# Patient Record
Sex: Female | Born: 1985 | Race: Black or African American | Hispanic: No | Marital: Single | Smoking: Never smoker
Health system: Southern US, Community
[De-identification: ages and names within clinical notes are randomized; demographics above are authoritative.]

## PROBLEM LIST (undated history)

## (undated) ENCOUNTER — Emergency Department (HOSPITAL_COMMUNITY): Disposition: A | Discharge status: Home / Self Care | Payer: Self-pay

## (undated) DIAGNOSIS — K37 Unspecified appendicitis: Secondary | ICD-10-CM

## (undated) DIAGNOSIS — O139 Gestational [pregnancy-induced] hypertension without significant proteinuria, unspecified trimester: Secondary | ICD-10-CM

## (undated) DIAGNOSIS — F4325 Adjustment disorder with mixed disturbance of emotions and conduct: Secondary | ICD-10-CM

## (undated) DIAGNOSIS — R87613 High grade squamous intraepithelial lesion on cytologic smear of cervix (HGSIL): Secondary | ICD-10-CM

## (undated) DIAGNOSIS — X789XXA Intentional self-harm by unspecified sharp object, initial encounter: Secondary | ICD-10-CM

## (undated) DIAGNOSIS — S61519A Laceration without foreign body of unspecified wrist, initial encounter: Secondary | ICD-10-CM

## (undated) HISTORY — PX: ABSCESS DRAINAGE: SHX1119

## (undated) HISTORY — PX: APPENDECTOMY: SHX54

---

## 2011-11-25 ENCOUNTER — Encounter (HOSPITAL_COMMUNITY): Payer: Self-pay | Admitting: *Deleted

## 2011-11-25 ENCOUNTER — Emergency Department (HOSPITAL_COMMUNITY)
Admission: EM | Admit: 2011-11-25 | Discharge: 2011-11-26 | Disposition: A | Discharge status: Home / Self Care | Payer: Self-pay | Attending: Emergency Medicine | Admitting: Emergency Medicine

## 2011-11-25 DIAGNOSIS — IMO0001 Reserved for inherently not codable concepts without codable children: Secondary | ICD-10-CM | POA: Insufficient documentation

## 2011-11-25 DIAGNOSIS — R5381 Other malaise: Secondary | ICD-10-CM | POA: Insufficient documentation

## 2011-11-25 DIAGNOSIS — N920 Excessive and frequent menstruation with regular cycle: Secondary | ICD-10-CM | POA: Insufficient documentation

## 2011-11-25 DIAGNOSIS — N390 Urinary tract infection, site not specified: Secondary | ICD-10-CM | POA: Insufficient documentation

## 2011-11-25 DIAGNOSIS — R51 Headache: Secondary | ICD-10-CM | POA: Insufficient documentation

## 2011-11-25 NOTE — ED Notes (Signed)
Pt has had a sharp, frontal headache for 2 days.  No fever or chills with this.  No sensitivity to light or sound with this.  Pt is alert and oriented, no neuro deficits with this

## 2011-11-26 LAB — WET PREP, GENITAL: Yeast Wet Prep HPF POC: NONE SEEN

## 2011-11-26 LAB — URINE MICROSCOPIC-ADD ON

## 2011-11-26 LAB — URINALYSIS, ROUTINE W REFLEX MICROSCOPIC
Glucose, UA: NEGATIVE mg/dL
Protein, ur: NEGATIVE mg/dL
pH: 5.5 (ref 5.0–8.0)

## 2011-11-26 MED ORDER — DIPHENHYDRAMINE HCL 50 MG/ML IJ SOLN
25.0000 mg | Freq: Once | INTRAMUSCULAR | Status: AC
  Administered 2011-11-26: 50 mg via INTRAMUSCULAR
  Filled 2011-11-26: qty 1

## 2011-11-26 MED ORDER — METOCLOPRAMIDE HCL 5 MG/ML IJ SOLN
10.0000 mg | Freq: Once | INTRAMUSCULAR | Status: AC
  Administered 2011-11-26: 10 mg via INTRAMUSCULAR
  Filled 2011-11-26: qty 2

## 2011-11-26 MED ORDER — DEXAMETHASONE SODIUM PHOSPHATE 10 MG/ML IJ SOLN
10.0000 mg | Freq: Once | INTRAMUSCULAR | Status: AC
  Administered 2011-11-26: 10 mg via INTRAMUSCULAR
  Filled 2011-11-26: qty 1

## 2011-11-26 MED ORDER — NITROFURANTOIN MONOHYD MACRO 100 MG PO CAPS: 100.0000 mg | ORAL_CAPSULE | Freq: Two times a day (BID) | ORAL | Status: AC

## 2011-11-26 NOTE — Discharge Instructions (Signed)
Your lab tests today showed possible urinary tract infection. This time your providers have decided to go ahead and treat this possible infection with antibiotics. Your given a prescription to use. Please follow the instructions in take medication for the full length of time. Please followup with primary care provider to be sure your symptoms have improved.   Urinary Tract Infection Infections of the urinary tract can start in several places. A bladder infection (cystitis), a kidney infection (pyelonephritis), and a prostate infection (prostatitis) are different types of urinary tract infections (UTIs). They usually get better if treated with medicines (antibiotics) that kill germs. Take all the medicine until it is gone. You or your child may feel better in a few days, but TAKE ALL MEDICINE or the infection may not respond and may become more difficult to treat. HOME CARE INSTRUCTIONS   Drink enough water and fluids to keep the urine clear or pale yellow. Cranberry juice is especially recommended, in addition to large amounts of water.   Avoid caffeine, tea, and carbonated beverages. They tend to irritate the bladder.   Alcohol may irritate the prostate.   Only take over-the-counter or prescription medicines for pain, discomfort, or fever as directed by your caregiver.  To prevent further infections:  Empty the bladder often. Avoid holding urine for long periods of time.   After a bowel movement, women should cleanse from front to back. Use each tissue only once.   Empty the bladder before and after sexual intercourse.  FINDING OUT THE RESULTS OF YOUR TEST Not all test results are available during your visit. If your or your child's test results are not back during the visit, make an appointment with your caregiver to find out the results. Do not assume everything is normal if you have not heard from your caregiver or the medical facility. It is important for you to follow up on all test  results. SEEK MEDICAL CARE IF:   There is back pain.   Your baby is older than 3 months with a rectal temperature of 100.5 F (38.1 C) or higher for more than 1 day.   Your or your child's problems (symptoms) are no better in 3 days. Return sooner if you or your child is getting worse.  SEEK IMMEDIATE MEDICAL CARE IF:   There is severe back pain or lower abdominal pain.   You or your child develops chills.   You have a fever.   Your baby is older than 3 months with a rectal temperature of 102 F (38.9 C) or higher.   Your baby is 35 months old or younger with a rectal temperature of 100.4 F (38 C) or higher.   There is nausea or vomiting.   There is continued burning or discomfort with urination.  MAKE SURE YOU:   Understand these instructions.   Will watch your condition.   Will get help right away if you are not doing well or get worse.  Document Released: 06/12/2005 Document Revised: 08/22/2011 Document Reviewed: 01/15/2007 [facility] Patient Information 2012 [place], [place].

## 2011-11-26 NOTE — ED Notes (Signed)
Isolation added by accident; removed.

## 2011-11-26 NOTE — ED Provider Notes (Signed)
History     CSN: [id number]  Arrival date & time 11/25/11  2033   First MD Initiated Contact with Patient 11/26/11 0005      Chief Complaint  Patient presents with  . Headache     HPI  History provided by the patient. Patient is a 26 year old female with no significant past medical history presents with multiple complaints of headache, bodyaches, increased fatigue and increased vaginal bleeding for the past several days. Patient reports that she has had increased menstrual bleeding for the past 5 days which is more than normal for her. She also reports having headache developed 2 days ago during her menstrual cycle. Headache is a frontal throbbing headache. She denies any aggravating or alleviating factors. She denies any associated fever, chills, sweats, focal neurologic deficits. Patient has not tried taking any over-the-counter medicines for her headache. Patient also reports feeling some associated fatigue. She denies any nausea vomiting diarrhea.     History reviewed. No pertinent past medical history.  Past Surgical History  Procedure Date  . Appendectomy     No family history on file.  History  Substance Use Topics  . Smoking status: Not on file  . Smokeless tobacco: Not on file  . Alcohol Use: No    OB History    Grav Para Term Preterm Abortions TAB SAB Ect Mult Living                  Review of Systems  Constitutional: Positive for fatigue. Negative for fever and chills.  Respiratory: Negative for cough.   Gastrointestinal: Negative for nausea, vomiting, abdominal pain, diarrhea and constipation.  Genitourinary: Negative for dysuria, frequency, hematuria, flank pain, vaginal bleeding and vaginal discharge.  Musculoskeletal: Positive for myalgias.  Neurological: Positive for headaches.  All other systems reviewed and are negative.    Allergies  Review of patient's allergies indicates no known allergies.  Home Medications  No current outpatient  prescriptions on file.  BP 140/88  Pulse 102  Temp(Src) 98.5 F (36.9 C) (Oral)  Resp 16  SpO2 99%  LMP 11/21/2011  Physical Exam  Nursing note and vitals reviewed. Constitutional: She is oriented to person, place, and time. She appears well-developed and well-nourished. No distress.  HENT:  Head: Normocephalic and atraumatic.  Eyes: Conjunctivae and EOM are normal. Pupils are equal, round, and reactive to light.  Neck: Normal range of motion. Neck supple.       No meningeal signs.  Cardiovascular: Normal rate and regular rhythm.   Pulmonary/Chest: Effort normal and breath sounds normal.  Abdominal: Soft.  Genitourinary: Cervix exhibits discharge. Cervix exhibits no motion tenderness and no friability. Right adnexum displays no mass, no tenderness and no fullness. Left adnexum displays no mass, no tenderness and no fullness.       Chaperone was present  Neurological: She is alert and oriented to person, place, and time. She has normal strength. No cranial nerve deficit or sensory deficit. Gait normal.  Skin: Skin is warm and dry. No rash noted.  Psychiatric: She has a normal mood and affect. Her behavior is normal.    ED Course  Procedures  Results for orders placed during the hospital encounter of 11/25/11  URINALYSIS, ROUTINE W REFLEX MICROSCOPIC      Component Value Range   Color, Urine YELLOW  YELLOW    APPearance CLOUDY (*) CLEAR    Specific Gravity, Urine 1.025  1.005 - 1.030    pH 5.5  5.0 - 8.0  Glucose, UA NEGATIVE  NEGATIVE (mg/dL)   Hgb urine dipstick LARGE (*) NEGATIVE    Bilirubin Urine NEGATIVE  NEGATIVE    Ketones, ur NEGATIVE  NEGATIVE (mg/dL)   Protein, ur NEGATIVE  NEGATIVE (mg/dL)   Urobilinogen, UA 0.2  0.0 - 1.0 (mg/dL)   Nitrite NEGATIVE  NEGATIVE    Leukocytes, UA SMALL (*) NEGATIVE   WET PREP, GENITAL      Component Value Range   Yeast Wet Prep HPF POC NONE SEEN  NONE SEEN    Trich, Wet Prep NONE SEEN  NONE SEEN    Clue Cells Wet Prep HPF  POC FEW (*) NONE SEEN    WBC, Wet Prep HPF POC MODERATE (*) NONE SEEN   GC/CHLAMYDIA PROBE AMP, GENITAL      Component Value Range   GC Probe Amp, Genital NEGATIVE  NEGATIVE    Chlamydia, DNA Probe POSITIVE (*) NEGATIVE   POCT PREGNANCY, URINE      Component Value Range   Preg Test, Ur NEGATIVE  NEGATIVE   URINE MICROSCOPIC-ADD ON      Component Value Range   Squamous Epithelial / LPF MANY (*) RARE    WBC, UA 3-6  <3 (WBC/hpf)   RBC / HPF 3-6  <3 (RBC/hpf)   Bacteria, UA FEW (*) RARE    Urine-Other MUCOUS PRESENT          1. UTI (lower urinary tract infection)       MDM  Patient seen and evaluated. Patient no acute distress.        Angus Seller, [place] 11/28/11 [phone]

## 2011-11-27 LAB — GC/CHLAMYDIA PROBE AMP, GENITAL: GC Probe Amp, Genital: NEGATIVE

## 2011-11-30 NOTE — ED Provider Notes (Signed)
Medical screening examination/treatment/procedure(s) were performed by non-physician practitioner and as supervising physician I was immediately available for consultation/collaboration.  Gerhard Munch, MD 11/30/11 2030

## 2011-12-01 NOTE — ED Notes (Signed)
+   Chlamydia. Chart sent to EDP office for review. Chart returned from EDP office. Recommended RX for doxycycline 100 mg PO BID X 7 days. Prescribed by Fayrene Helper PA-C. Attempted to call patient. No answer. Left voicemail for patient to call back.

## 2011-12-02 NOTE — ED Notes (Signed)
No answer on attempt to call.

## 2011-12-25 ENCOUNTER — Emergency Department (HOSPITAL_COMMUNITY)
Admission: EM | Admit: 2011-12-25 | Discharge: 2011-12-25 | Disposition: A | Discharge status: Home / Self Care | Payer: Self-pay | Attending: Emergency Medicine | Admitting: Emergency Medicine

## 2011-12-25 ENCOUNTER — Encounter (HOSPITAL_COMMUNITY): Payer: Self-pay | Admitting: *Deleted

## 2011-12-25 DIAGNOSIS — K029 Dental caries, unspecified: Secondary | ICD-10-CM | POA: Insufficient documentation

## 2011-12-25 DIAGNOSIS — K0889 Other specified disorders of teeth and supporting structures: Secondary | ICD-10-CM

## 2011-12-25 MED ORDER — PENICILLIN V POTASSIUM 500 MG PO TABS: 500.0000 mg | ORAL_TABLET | Freq: Three times a day (TID) | ORAL | Status: AC

## 2011-12-25 MED ORDER — IBUPROFEN 600 MG PO TABS: 600.0000 mg | ORAL_TABLET | Freq: Four times a day (QID) | ORAL | Status: AC | PRN

## 2011-12-25 MED ORDER — HYDROCODONE-ACETAMINOPHEN 5-325 MG PO TABS
1.0000 | ORAL_TABLET | Freq: Once | ORAL | Status: AC
  Administered 2011-12-25: 1 via ORAL
  Filled 2011-12-25: qty 1

## 2011-12-25 MED ORDER — IBUPROFEN 200 MG PO TABS
600.0000 mg | ORAL_TABLET | Freq: Once | ORAL | Status: AC
  Administered 2011-12-25: 600 mg via ORAL
  Filled 2011-12-25: qty 3

## 2011-12-25 MED ORDER — HYDROCODONE-ACETAMINOPHEN 5-500 MG PO TABS: 1.0000 | ORAL_TABLET | Freq: Four times a day (QID) | ORAL | Status: AC | PRN

## 2011-12-25 NOTE — Discharge Instructions (Signed)
Take ibuprofen for your pain. Vicodin for severe pain as prescribed as needed. Do not drive if taking. Take penicillin as prescribed until all gone. Follow up with a dentist for further evaluation and treatment.   Dental Pain A tooth ache may be caused by cavities (tooth decay). Cavities expose the nerve of the tooth to air and hot or cold temperatures. It may come from an infection or abscess (also called a boil or furuncle) around your tooth. It is also often caused by dental caries (tooth decay). This causes the pain you are having. DIAGNOSIS  Your caregiver can diagnose this problem by exam. TREATMENT   If caused by an infection, it may be treated with medications which kill germs (antibiotics) and pain medications as prescribed by your caregiver. Take medications as directed.   Only take over-the-counter or prescription medicines for pain, discomfort, or fever as directed by your caregiver.   Whether the tooth ache today is caused by infection or dental disease, you should see your dentist as soon as possible for further care.  SEEK MEDICAL CARE IF: The exam and treatment you received today has been provided on an emergency basis only. This is not a substitute for complete medical or dental care. If your problem worsens or new problems (symptoms) appear, and you are unable to meet with your dentist, call or return to this location. SEEK IMMEDIATE MEDICAL CARE IF:   You have a fever.   You develop redness and swelling of your face, jaw, or neck.   You are unable to open your mouth.   You have severe pain uncontrolled by pain medicine.  MAKE SURE YOU:   Understand these instructions.   Will watch your condition.   Will get help right away if you are not doing well or get worse.  Document Released: 09/02/2005 Document Revised: 08/22/2011 Document Reviewed: 04/20/2008 [facility] Patient Information 2012 [place], [place].

## 2011-12-25 NOTE — ED Notes (Signed)
Patient states history of a cap on tooth that is "already dead" however three days ago developed dental pain at capped tooth and 2 days ago able to remove cap. Pain worsening overtime currently 10/10 throbbing. Air way intact bilateral equal chest rise and fall.

## 2011-12-25 NOTE — ED Provider Notes (Signed)
History     CSN: [id number]  Arrival date & time 12/25/11  1148   First MD Initiated Contact with Patient 12/25/11 1213      Chief Complaint  Patient presents with  . Dental Pain    (Consider location/radiation/quality/duration/timing/severity/associated sxs/prior treatment) Patient is a 26 y.o. female presenting with tooth pain. The history is provided by the patient.  Dental PainThe primary symptoms include mouth pain and dental injury. The symptoms began 2 days ago. The symptoms are improving. The symptoms occur constantly.  Additional symptoms include: gum swelling and gum tenderness.  Pt states she had a filling and a crown placed on that tooth about 3 ya. States about 3 days ago, crown fell off, and she is having pain in that tooth. States tooth is chipping, little pieces at a time. Admits to gum pain and swelling. No fever, chills, facial swelling. No other complaints. Did not take any medications for this.   History reviewed. No pertinent past medical history.  Past Surgical History  Procedure Date  . Appendectomy     History reviewed. No pertinent family history.  History  Substance Use Topics  . Smoking status: Not on file  . Smokeless tobacco: Not on file  . Alcohol Use: No    OB History    Grav Para Term Preterm Abortions TAB SAB Ect Mult Living                  Review of Systems  HENT: Positive for dental problem.   All other systems reviewed and are negative.    Allergies  Review of patient's allergies indicates no known allergies.  Home Medications  No current outpatient prescriptions on file.  BP 140/94  Pulse 93  Temp(Src) 98.3 F (36.8 C) (Oral)  Resp 14  SpO2 99%  LMP 11/19/2011  Physical Exam  Nursing note and vitals reviewed. Constitutional: She is oriented to person, place, and time. She appears well-developed and well-nourished. No distress.  HENT:  Head: Normocephalic.  Right Ear: External ear normal.  Left Ear: External ear  normal.  Nose: Nose normal.  Mouth/Throat: Oropharynx is clear and moist.       Right lower fist molar crown is missing. Tooth decayed to the gumline. Redness around the tooth, gum swelling. Tender to palpation.   Eyes: Conjunctivae are normal.  Neck: Neck supple.  Cardiovascular: Normal rate, regular rhythm and normal heart sounds.   Pulmonary/Chest: Effort normal and breath sounds normal. No respiratory distress. She has no wheezes.  Neurological: She is alert and oriented to person, place, and time.  Skin: Skin is warm and dry.  Psychiatric: She has a normal mood and affect.    ED Course  Procedures (including critical care time)   Dental carries with possible infection. Pt afebrile, no lymphadenopathy. No facial swelling. Will d/c home with pain medications, antibiotics, follow up.   1. Pain, dental       MDM          Lottie Mussel, PA 12/25/11 1558

## 2011-12-25 NOTE — ED Notes (Signed)
Reports right side lower toothache x 3 days and now has headache.

## 2011-12-27 NOTE — ED Provider Notes (Signed)
Medical screening examination/treatment/procedure(s) were performed by non-physician practitioner and as supervising physician I was immediately available for consultation/collaboration.  Silas Muff T Marnesha Gagen, MD 12/27/11 1546 

## 2012-04-16 ENCOUNTER — Encounter (HOSPITAL_COMMUNITY): Payer: Self-pay | Admitting: Emergency Medicine

## 2012-04-16 ENCOUNTER — Emergency Department (HOSPITAL_COMMUNITY)
Admission: EM | Admit: 2012-04-16 | Discharge: 2012-04-16 | Disposition: A | Discharge status: Home / Self Care | Payer: Self-pay | Attending: Emergency Medicine | Admitting: Emergency Medicine

## 2012-04-16 DIAGNOSIS — K029 Dental caries, unspecified: Secondary | ICD-10-CM | POA: Insufficient documentation

## 2012-04-16 DIAGNOSIS — K0889 Other specified disorders of teeth and supporting structures: Secondary | ICD-10-CM

## 2012-04-16 MED ORDER — OXYCODONE-ACETAMINOPHEN 5-325 MG PO TABS: 1.0000 | ORAL_TABLET | Freq: Four times a day (QID) | ORAL | Status: AC | PRN

## 2012-04-16 MED ORDER — PENICILLIN V POTASSIUM 500 MG PO TABS: 500.0000 mg | ORAL_TABLET | Freq: Three times a day (TID) | ORAL | Status: AC

## 2012-04-16 MED ORDER — IBUPROFEN 600 MG PO TABS: 600.0000 mg | ORAL_TABLET | Freq: Four times a day (QID) | ORAL | Status: AC | PRN

## 2012-04-16 NOTE — ED Notes (Signed)
Pt reports tooth ache on L lower and upper molar that's affecting L face and L ear- symptoms started last night; reports was eating candy and then it started but has not gone away

## 2012-04-16 NOTE — ED Notes (Signed)
Pt has crown missing and cavity that fell out.  Pt having pain on both sides of mouth and jaw.  Pt has not seen dentist.

## 2012-04-16 NOTE — ED Provider Notes (Signed)
History   This chart was scribed for Candace Chick, MD by Shari Heritage. The patient was seen in room TR02C/TR02C. Patient's care was started at 1527.     CSN: [id number]  Arrival date & time 04/16/12  1527   First MD Initiated Contact with Patient 04/16/12 1632      Chief Complaint  Patient presents with  . Dental Pain    (Consider location/radiation/quality/duration/timing/severity/associated sxs/prior treatment) Patient is a 26 y.o. female presenting with tooth pain. The history is provided by the patient. No language interpreter was used.  Dental PainThe primary symptoms include mouth pain. The symptoms began yesterday. The symptoms are worsening. The symptoms are new. The symptoms occur constantly.  Mouth pain began 12 - 24 hours ago. Mouth pain occurs constantly. Mouth pain is worsening. Affected locations include: teeth and cheek(s).    Candace Rivera is a 26 y.o. female who presents to the Emergency Department complaining of moderate, constant left lower molar (dental) pain that radiates up to her left ear onset 1 day ago. Patient states that she was eating candy when pain started and that it has been persistent since then. Patient reports that she has a cavity in the left lower molar that hasn't been filled. Patient states that she has felt warm, but hasn't taken her temperature. Temperature in the ED at 3:34pm today was 97.2. Patient does not have a regular dentist. Patient has never smoked.  History reviewed. No pertinent past medical history.  Past Surgical History  Procedure Date  . Appendectomy     No family history on file.  History  Substance Use Topics  . Smoking status: Never Smoker   . Smokeless tobacco: Not on file  . Alcohol Use: No    OB History    Grav Para Term Preterm Abortions TAB SAB Ect Mult Living                  Review of Systems  HENT: Positive for dental problem.   All other systems reviewed and are negative.    Allergies  Review  of patient's allergies indicates no known allergies.  Home Medications   Current Outpatient Rx  Name Route Sig Dispense Refill  . IBUPROFEN 600 MG PO TABS Oral Take 1 tablet (600 mg total) by mouth every 6 (six) hours as needed for pain. 30 tablet 0  . OXYCODONE-ACETAMINOPHEN 5-325 MG PO TABS Oral Take 1-2 tablets by mouth every 6 (six) hours as needed for pain. 15 tablet 0  . PENICILLIN V POTASSIUM 500 MG PO TABS Oral Take 1 tablet (500 mg total) by mouth 3 (three) times daily. 30 tablet 0    BP 112/81  Pulse 90  Temp 97.9 F (36.6 C) (Oral)  Resp 14  SpO2 100%  LMP 03/21/2012  Physical Exam  Nursing note and vitals reviewed. Constitutional: She is oriented to person, place, and time. She appears well-developed and well-nourished. No distress.  HENT:  Head: Normocephalic and atraumatic.  Mouth/Throat: No dental abscesses.       Left lower posterior molar is approximately 50% eroded. No evidence of abscess.  Eyes: EOM are normal. Pupils are equal, round, and reactive to light.  Neck: Neck supple. No tracheal deviation present.  Pulmonary/Chest: No respiratory distress.  Musculoskeletal: Normal range of motion. She exhibits no edema.  Neurological: She is alert and oriented to person, place, and time. No sensory deficit.  Skin: Skin is warm and dry.  Psychiatric: She has a normal mood and affect.  Her behavior is normal.    ED Course  Procedures (including critical care time) DIAGNOSTIC STUDIES: Oxygen Saturation is 100% on room air, normal by my interpretation.    COORDINATION OF CARE: 4:53pm- Patient informed of current plan for treatment and evaluation and agrees with plan at this time. Patient should follow up with a dentist to address her dental pain. Have provided contact information for several dentists so patient can make an appointment. Will discharge patient with prescriptions for Percocet 5-325 mg, Ibuprofen 600mg  and Veetid 500 mg.   Labs Reviewed - No data to  display  No results found.   1. Pain, dental       MDM  Pt with dental pain, presents with overall poor dentition, decay.  No periapical abscess.  Given rx for pain meds as well as antibiotics.  Discharged with strict return precautions.  Pt agreeable with plan.  Strongly encouraged to arrange for dental followup    I personally performed the services described in this documentation, which was scribed in my presence. The recorded information has been reviewed and considered.   Candace Chick, MD 04/17/12 [phone]

## 2012-07-08 ENCOUNTER — Emergency Department (HOSPITAL_COMMUNITY)
Admission: EM | Admit: 2012-07-08 | Discharge: 2012-07-08 | Disposition: A | Discharge status: Home / Self Care | Payer: Self-pay | Attending: Emergency Medicine | Admitting: Emergency Medicine

## 2012-07-08 DIAGNOSIS — M549 Dorsalgia, unspecified: Secondary | ICD-10-CM | POA: Insufficient documentation

## 2012-07-08 DIAGNOSIS — N898 Other specified noninflammatory disorders of vagina: Secondary | ICD-10-CM | POA: Insufficient documentation

## 2012-07-08 DIAGNOSIS — R51 Headache: Secondary | ICD-10-CM | POA: Insufficient documentation

## 2012-07-08 LAB — WET PREP, GENITAL
Clue Cells Wet Prep HPF POC: NONE SEEN
Trich, Wet Prep: NONE SEEN
Yeast Wet Prep HPF POC: NONE SEEN

## 2012-07-08 LAB — URINALYSIS, ROUTINE W REFLEX MICROSCOPIC
Leukocytes, UA: NEGATIVE
Nitrite: NEGATIVE
Protein, ur: NEGATIVE mg/dL
Specific Gravity, Urine: 1.026 (ref 1.005–1.030)
Urobilinogen, UA: 0.2 mg/dL (ref 0.0–1.0)

## 2012-07-08 LAB — PREGNANCY, URINE: Preg Test, Ur: NEGATIVE

## 2012-07-08 MED ORDER — CEFTRIAXONE SODIUM 250 MG IJ SOLR
250.0000 mg | Freq: Once | INTRAMUSCULAR | Status: AC
  Administered 2012-07-08: 250 mg via INTRAMUSCULAR
  Filled 2012-07-08: qty 250

## 2012-07-08 MED ORDER — DOXYCYCLINE HYCLATE 100 MG PO CAPS: 100.0000 mg | ORAL_CAPSULE | Freq: Two times a day (BID) | ORAL | Status: DC

## 2012-07-08 MED ORDER — AZITHROMYCIN 250 MG PO TABS: 1000.0000 mg | ORAL_TABLET | Freq: Once | ORAL | Status: DC

## 2012-07-08 MED ORDER — METRONIDAZOLE 500 MG PO TABS
2000.0000 mg | ORAL_TABLET | Freq: Once | ORAL | Status: AC
  Administered 2012-07-08: 2000 mg via ORAL
  Filled 2012-07-08: qty 1

## 2012-07-08 MED ORDER — LIDOCAINE HCL (PF) 1 % IJ SOLN
INTRAMUSCULAR | Status: AC
  Filled 2012-07-08: qty 5

## 2012-07-08 NOTE — ED Notes (Signed)
Pt watching TV. Denies pain at present. Awaiting pelvic exam.

## 2012-07-08 NOTE — ED Provider Notes (Signed)
History     CSN: [id number]  Arrival date & time 07/08/12  0827   First MD Initiated Contact with Patient 07/08/12 [phone]      Chief Complaint  Patient presents with  . Vaginal Discharge  . Headache  . Back Pain    (Consider location/radiation/quality/duration/timing/severity/associated sxs/prior treatment) HPI Patient presents to the emergency department with a 2 week history of vaginal discharge.  Patient, states, that she has a partner, that she sexually active with that states he has Trichomonas.  Patient, states, that she's not had any fevers, nausea, vomiting, diarrhea, back pain, or weakness.  Patient, states, that the discharge does not have any specific foul odor or color.  Patient, states, that she's had some mild lower abdominal pain.  Patient denies any treatment prior to arrival, for her symptoms.  Patient, states, that nothing seems to make the discomfort, better or worse. No past medical history on file.  Past Surgical History  Procedure Date  . Appendectomy     No family history on file.  History  Substance Use Topics  . Smoking status: Never Smoker   . Smokeless tobacco: Not on file  . Alcohol Use: No    OB History    Grav Para Term Preterm Abortions TAB SAB Ect Mult Living                  Review of Systems All other systems negative except as documented in the HPI. All pertinent positives and negatives as reviewed in the HPI.  Allergies  Review of patient's allergies indicates no known allergies.  Home Medications  No current outpatient prescriptions on file.  BP 104/70  Pulse 88  Temp 98.2 F (36.8 C) (Oral)  Resp 20  LMP 07/01/2012  Physical Exam  Nursing note and vitals reviewed. Constitutional: She is oriented to person, place, and time. She appears well-developed and well-nourished. No distress.  HENT:  Head: Normocephalic and atraumatic.  Eyes: Pupils are equal, round, and reactive to light.  Neck: Normal range of motion. Neck  supple.  Cardiovascular: Normal rate, regular rhythm and normal heart sounds.   Pulmonary/Chest: Effort normal and breath sounds normal.  Genitourinary: There is no rash, tenderness or lesion on the right labia. There is no rash, tenderness or lesion on the left labia. Cervix exhibits discharge. Cervix exhibits no motion tenderness. Right adnexum displays no mass and no tenderness. Left adnexum displays no mass and no tenderness. No erythema, tenderness or bleeding around the vagina. Vaginal discharge found.  Neurological: She is alert and oriented to person, place, and time.  Skin: Skin is warm and dry. No rash noted.    ED Course  Procedures (including critical care time)  Labs Reviewed  URINALYSIS, ROUTINE W REFLEX MICROSCOPIC - Abnormal; Notable for the following:    APPearance CLOUDY (*)     All other components within normal limits  WET PREP, GENITAL - Abnormal; Notable for the following:    WBC, Wet Prep HPF POC FEW (*)     All other components within normal limits  PREGNANCY, URINE  GC/CHLAMYDIA PROBE AMP, GENITAL   Patient be treated for a vaginal infection, along with possible low-level PID.  Patient is advised to return here for any worsening in her condition.  Patient is advised to follow with the GYN for further care and evaluation.   MDM  MDM Reviewed: vitals and nursing note Interpretation: labs            Carlyle Dolly, PA-C  07/08/12 1227 

## 2012-07-08 NOTE — ED Notes (Signed)
Pt states her sexual partner was dx with trich on Oct 21st and want to be treated. Also complains of low back pain and headache for a week

## 2012-07-08 NOTE — ED Notes (Signed)
Pelvic exam completed by TEch and PA. Specimens obtained and sent to lab

## 2012-07-08 NOTE — ED Provider Notes (Signed)
Medical screening examination/treatment/procedure(s) were performed by non-physician practitioner and as supervising physician I was immediately available for consultation/collaboration.   Carleene Cooper III, MD 07/08/12 [phone]

## 2012-07-08 NOTE — ED Notes (Signed)
Pelvic cart set up. And procedure explained to patient

## 2012-07-09 LAB — GC/CHLAMYDIA PROBE AMP, GENITAL
Chlamydia, DNA Probe: NEGATIVE
GC Probe Amp, Genital: NEGATIVE

## 2012-08-29 ENCOUNTER — Emergency Department (HOSPITAL_COMMUNITY)
Admission: EM | Admit: 2012-08-29 | Discharge: 2012-08-29 | Disposition: A | Discharge status: Home / Self Care | Payer: Self-pay | Attending: Emergency Medicine | Admitting: Emergency Medicine

## 2012-08-29 ENCOUNTER — Encounter (HOSPITAL_COMMUNITY): Payer: Self-pay

## 2012-08-29 DIAGNOSIS — K029 Dental caries, unspecified: Secondary | ICD-10-CM | POA: Insufficient documentation

## 2012-08-29 DIAGNOSIS — R238 Other skin changes: Secondary | ICD-10-CM

## 2012-08-29 DIAGNOSIS — L089 Local infection of the skin and subcutaneous tissue, unspecified: Secondary | ICD-10-CM | POA: Insufficient documentation

## 2012-08-29 DIAGNOSIS — H9209 Otalgia, unspecified ear: Secondary | ICD-10-CM | POA: Insufficient documentation

## 2012-08-29 MED ORDER — IBUPROFEN 600 MG PO TABS: 600.0000 mg | ORAL_TABLET | Freq: Four times a day (QID) | ORAL | Status: DC | PRN

## 2012-08-29 MED ORDER — TRAMADOL HCL 50 MG PO TABS: 50.0000 mg | ORAL_TABLET | Freq: Four times a day (QID) | ORAL | Status: DC | PRN

## 2012-08-29 NOTE — ED Provider Notes (Signed)
Medical screening examination/treatment/procedure(s) were performed by non-physician practitioner and as supervising physician I was immediately available for consultation/collaboration.   Charles B. Bernette Mayers, MD 08/29/12 2249

## 2012-08-29 NOTE — ED Notes (Signed)
Pt reports (L) side upper and lower teeth pain x2 days, pt also reports (L) earache and itching d/t toothache

## 2012-08-29 NOTE — ED Provider Notes (Signed)
History     CSN: [id number]  Arrival date & time 08/29/12  1905   First MD Initiated Contact with Patient 08/29/12 1951      Chief Complaint  Patient presents with  . Dental Pain    (Consider location/radiation/quality/duration/timing/severity/associated sxs/prior treatment) HPI Comments: Patient has been to the emergency department 3 times since April of this year for persistent dental decay.  She has not needed.  Dentist appointment.  She has been on several courses of antibiotic Percocet, and ibuprofen for the same dental disease.  Tonight.  She reports, that her breast or itching.  Her last menstrual cycle was 2 weeks, ago  Patient is a 26 y.o. female presenting with tooth pain. The history is provided by the patient.  Dental PainThe primary symptoms include mouth pain. Primary symptoms do not include headaches or fever.  Additional symptoms include: ear pain.    History reviewed. No pertinent past medical history.  Past Surgical History  Procedure Date  . Appendectomy     History reviewed. No pertinent family history.  History  Substance Use Topics  . Smoking status: Never Smoker   . Smokeless tobacco: Not on file  . Alcohol Use: No    OB History    Grav Para Term Preterm Abortions TAB SAB Ect Mult Living                  Review of Systems  Constitutional: Negative for fever.  HENT: Positive for ear pain and dental problem. Negative for rhinorrhea.   Skin: Negative for rash and wound.  Neurological: Negative for dizziness and headaches.    Allergies  Review of patient's allergies indicates no known allergies.  Home Medications   Current Outpatient Rx  Name  Route  Sig  Dispense  Refill  . DOXYCYCLINE HYCLATE 100 MG PO CAPS   Oral   Take 1 capsule (100 mg total) by mouth 2 (two) times daily.   28 capsule   0     BP 119/75  Pulse 95  Temp 98.3 F (36.8 C) (Oral)  Resp 18  SpO2 99%  LMP 08/17/2012  Physical Exam  Constitutional: She  appears well-developed and well-nourished.  HENT:  Head: Normocephalic.  Right Ear: External ear normal.  Left Ear: External ear normal.  Nose: Nose normal.  Mouth/Throat: Oropharynx is clear and moist.       Dental decay, in all teeth, except the front central incisors, upper and lower.  There is no gum erythema  Eyes: Pupils are equal, round, and reactive to light.  Neck: Normal range of motion.  Cardiovascular: Normal rate.   Pulmonary/Chest: Effort normal.       Breasts are soft.  No nipple discharge.  No rash or erythema or skin changes  Musculoskeletal: Normal range of motion.  Neurological: She is alert.  Skin: Skin is warm. No rash noted. No erythema.    ED Course  Procedures (including critical care time)  Labs Reviewed - No data to display No results found.   No diagnosis found.    MDM           Arman Filter, NP 08/29/12 2042

## 2012-08-29 NOTE — ED Notes (Addendum)
Pt also reports bilateral breast pain, pt grabs both breast and sts "all of this hurts." Pt denies chest pain, SOB, or cough.

## 2013-03-08 ENCOUNTER — Emergency Department (HOSPITAL_COMMUNITY)
Admission: EM | Admit: 2013-03-08 | Discharge: 2013-03-08 | Disposition: A | Discharge status: Home / Self Care | Payer: Self-pay | Attending: Emergency Medicine | Admitting: Emergency Medicine

## 2013-03-08 ENCOUNTER — Encounter (HOSPITAL_COMMUNITY): Payer: Self-pay | Admitting: *Deleted

## 2013-03-08 DIAGNOSIS — K089 Disorder of teeth and supporting structures, unspecified: Secondary | ICD-10-CM | POA: Insufficient documentation

## 2013-03-08 NOTE — ED Notes (Signed)
Patient no answer x3

## 2013-03-08 NOTE — ED Notes (Signed)
Pt is here with lower dental tooth and pain radiates into ear and head for 3 days

## 2013-04-20 ENCOUNTER — Emergency Department (HOSPITAL_COMMUNITY): Payer: Self-pay

## 2013-04-20 ENCOUNTER — Emergency Department (HOSPITAL_COMMUNITY)
Admission: EM | Admit: 2013-04-20 | Discharge: 2013-04-20 | Disposition: A | Discharge status: Home / Self Care | Payer: Self-pay | Attending: Emergency Medicine | Admitting: Emergency Medicine

## 2013-04-20 ENCOUNTER — Encounter (HOSPITAL_COMMUNITY): Payer: Self-pay | Admitting: Emergency Medicine

## 2013-04-20 DIAGNOSIS — S8992XA Unspecified injury of left lower leg, initial encounter: Secondary | ICD-10-CM

## 2013-04-20 DIAGNOSIS — Y9389 Activity, other specified: Secondary | ICD-10-CM | POA: Insufficient documentation

## 2013-04-20 DIAGNOSIS — Z3202 Encounter for pregnancy test, result negative: Secondary | ICD-10-CM | POA: Insufficient documentation

## 2013-04-20 DIAGNOSIS — Y92410 Unspecified street and highway as the place of occurrence of the external cause: Secondary | ICD-10-CM | POA: Insufficient documentation

## 2013-04-20 DIAGNOSIS — S8990XA Unspecified injury of unspecified lower leg, initial encounter: Secondary | ICD-10-CM | POA: Insufficient documentation

## 2013-04-20 LAB — POCT I-STAT, CHEM 8
BUN: 9 mg/dL (ref 6–23)
Chloride: 108 mEq/L (ref 96–112)
Sodium: 143 mEq/L (ref 135–145)
TCO2: 24 mmol/L (ref 0–100)

## 2013-04-20 LAB — POCT PREGNANCY, URINE: Preg Test, Ur: NEGATIVE

## 2013-04-20 MED ORDER — PHENYLEPHRINE 200 MCG/ML FOR PRIAPISM / HYPOTENSION: 500.0000 ug | Freq: Once | INTRAMUSCULAR | Status: DC

## 2013-04-20 MED ORDER — ONDANSETRON HCL 4 MG/2ML IJ SOLN: 4.0000 mg | Freq: Once | INTRAMUSCULAR | Status: AC

## 2013-04-20 MED ORDER — PHENYLEPHRINE HCL 10 MG/ML IJ SOLN: 500.0000 ug | Freq: Once | INTRAMUSCULAR | Status: DC

## 2013-04-20 MED ORDER — OXYCODONE-ACETAMINOPHEN 5-325 MG PO TABS: 2.0000 | ORAL_TABLET | ORAL | Status: DC | PRN

## 2013-04-20 MED ORDER — IOHEXOL 350 MG/ML SOLN
100.0000 mL | Freq: Once | INTRAVENOUS | Status: AC | PRN
  Administered 2013-04-20: 100 mL via INTRAVENOUS

## 2013-04-20 MED ORDER — FENTANYL CITRATE 0.05 MG/ML IJ SOLN
50.0000 ug | INTRAMUSCULAR | Status: DC | PRN
  Administered 2013-04-20: 50 ug via INTRAVENOUS
  Filled 2013-04-20: qty 2

## 2013-04-20 MED ORDER — ONDANSETRON HCL 4 MG/2ML IJ SOLN
INTRAMUSCULAR | Status: AC
  Administered 2013-04-20: 4 mg via INTRAVENOUS
  Filled 2013-04-20: qty 2
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
## 2013-04-26 ENCOUNTER — Encounter (HOSPITAL_COMMUNITY): Payer: Self-pay | Admitting: Emergency Medicine

## 2013-04-26 ENCOUNTER — Emergency Department (HOSPITAL_COMMUNITY)
Admission: EM | Admit: 2013-04-26 | Discharge: 2013-04-26 | Disposition: A | Discharge status: Home / Self Care | Payer: Self-pay | Attending: Emergency Medicine | Admitting: Emergency Medicine

## 2013-04-26 DIAGNOSIS — Y9289 Other specified places as the place of occurrence of the external cause: Secondary | ICD-10-CM | POA: Insufficient documentation

## 2013-04-26 DIAGNOSIS — S86911D Strain of unspecified muscle(s) and tendon(s) at lower leg level, right leg, subsequent encounter: Secondary | ICD-10-CM

## 2013-04-26 DIAGNOSIS — Y9389 Activity, other specified: Secondary | ICD-10-CM | POA: Insufficient documentation

## 2013-04-26 DIAGNOSIS — IMO0002 Reserved for concepts with insufficient information to code with codable children: Secondary | ICD-10-CM | POA: Insufficient documentation

## 2013-04-26 MED ORDER — NAPROXEN 500 MG PO TABS: 500.0000 mg | ORAL_TABLET | Freq: Two times a day (BID) | ORAL | Status: DC

## 2013-04-26 MED ORDER — OXYCODONE-ACETAMINOPHEN 5-325 MG PO TABS: 1.0000 | ORAL_TABLET | ORAL | Status: DC | PRN
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
## 2013-04-26 NOTE — ED Notes (Signed)
Pt. Stated, I'm having bad rt. Knee pain, seen at [facility] last Tuesday and given brace.
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
## 2013-04-30 NOTE — ED Provider Notes (Signed)
Medical screening examination/treatment/procedure(s) were performed by non-physician practitioner and as supervising physician I was immediately available for consultation/collaboration.   Thia Olesen Joseph Gracemarie Skeet, MD 04/30/13 1105 

## 2013-06-24 ENCOUNTER — Emergency Department (HOSPITAL_COMMUNITY)
Admission: EM | Admit: 2013-06-24 | Discharge: 2013-06-24 | Disposition: A | Discharge status: Home / Self Care | Payer: No Typology Code available for payment source | Attending: Emergency Medicine | Admitting: Emergency Medicine

## 2013-06-24 ENCOUNTER — Encounter (HOSPITAL_COMMUNITY): Payer: Self-pay | Admitting: Emergency Medicine

## 2013-06-24 DIAGNOSIS — N751 Abscess of Bartholin's gland: Secondary | ICD-10-CM | POA: Insufficient documentation

## 2013-06-24 MED ORDER — OXYCODONE-ACETAMINOPHEN 5-325 MG PO TABS
2.0000 | ORAL_TABLET | Freq: Once | ORAL | Status: AC
  Administered 2013-06-24: 2 via ORAL
  Filled 2013-06-24: qty 2
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
## 2013-06-24 NOTE — ED Notes (Signed)
Pt. reports right side vaginal pain " knot" for 1 week , denies injury .

## 2014-06-29 ENCOUNTER — Encounter (HOSPITAL_COMMUNITY): Payer: Self-pay | Admitting: Emergency Medicine

## 2014-06-29 ENCOUNTER — Emergency Department (HOSPITAL_COMMUNITY)
Admission: EM | Admit: 2014-06-29 | Discharge: 2014-06-29 | Disposition: A | Discharge status: Home / Self Care | Payer: No Typology Code available for payment source | Attending: Emergency Medicine | Admitting: Emergency Medicine

## 2014-06-29 DIAGNOSIS — G8929 Other chronic pain: Secondary | ICD-10-CM | POA: Insufficient documentation

## 2014-06-29 DIAGNOSIS — S3992XA Unspecified injury of lower back, initial encounter: Secondary | ICD-10-CM | POA: Insufficient documentation

## 2014-06-29 DIAGNOSIS — T07 Unspecified multiple injuries: Secondary | ICD-10-CM | POA: Insufficient documentation

## 2014-06-29 DIAGNOSIS — S8991XA Unspecified injury of right lower leg, initial encounter: Secondary | ICD-10-CM | POA: Insufficient documentation

## 2014-06-29 DIAGNOSIS — S4992XA Unspecified injury of left shoulder and upper arm, initial encounter: Secondary | ICD-10-CM | POA: Insufficient documentation

## 2014-06-29 DIAGNOSIS — T07XXXA Unspecified multiple injuries, initial encounter: Secondary | ICD-10-CM

## 2014-06-29 MED ORDER — IBUPROFEN 800 MG PO TABS: 800.0000 mg | ORAL_TABLET | Freq: Three times a day (TID) | ORAL | Status: DC

## 2014-06-29 NOTE — ED Notes (Signed)
Declined W/C at D/C and was escorted to lobby by RN. 
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
## 2014-06-29 NOTE — Discharge Instructions (Signed)
Cryotherapy °Cryotherapy means treatment with cold. Ice or gel packs can be used to reduce both pain and swelling. Ice is the most helpful within the first 24 to 48 hours after an injury or flare-up from overusing a muscle or joint. Sprains, strains, spasms, burning pain, shooting pain, and aches can all be eased with ice. Ice can also be used when recovering from surgery. Ice is effective, has very few side effects, and is safe for most people to use. °PRECAUTIONS  °Ice is not a safe treatment option for people with: °· Raynaud phenomenon. This is a condition affecting small blood vessels in the extremities. Exposure to cold may cause your problems to return. °· Cold hypersensitivity. There are many forms of cold hypersensitivity, including: °¨ Cold urticaria. Red, itchy hives appear on the skin when the tissues begin to warm after being iced. °¨ Cold erythema. This is a red, itchy rash caused by exposure to cold. °¨ Cold hemoglobinuria. Red blood cells break down when the tissues begin to warm after being iced. The hemoglobin that carry oxygen are passed into the urine because they cannot combine with blood proteins fast enough. °· Numbness or altered sensitivity in the area being iced. °If you have any of the following conditions, do not use ice until you have discussed cryotherapy with your caregiver: °· Heart conditions, such as arrhythmia, angina, or chronic heart disease. °· High blood pressure. °· Healing wounds or open skin in the area being iced. °· Current infections. °· Rheumatoid arthritis. °· Poor circulation. °· Diabetes. °Ice slows the blood flow in the region it is applied. This is beneficial when trying to stop inflamed tissues from spreading irritating chemicals to surrounding tissues. However, if you expose your skin to cold temperatures for too long or without the proper protection, you can damage your skin or nerves. Watch for signs of skin damage due to cold. °HOME CARE INSTRUCTIONS °Follow  these tips to use ice and cold packs safely. °· Place a dry or damp towel between the ice and skin. A damp towel will cool the skin more quickly, so you may need to shorten the time that the ice is used. °· For a more rapid response, add gentle compression to the ice. °· Ice for no more than 10 to 20 minutes at a time. The bonier the area you are icing, the less time it will take to get the benefits of ice. °· Check your skin after 5 minutes to make sure there are no signs of a poor response to cold or skin damage. °· Rest 20 minutes or more between uses. °· Once your skin is numb, you can end your treatment. You can test numbness by very lightly touching your skin. The touch should be so light that you do not see the skin dimple from the pressure of your fingertip. When using ice, most people will feel these normal sensations in this order: cold, burning, aching, and numbness. °· Do not use ice on someone who cannot communicate their responses to pain, such as small children or people with dementia. °HOW TO MAKE AN ICE PACK °Ice packs are the most common way to use ice therapy. Other methods include ice massage, ice baths, and cryosprays. Muscle creams that cause a cold, tingly feeling do not offer the same benefits that ice offers and should not be used as a substitute unless recommended by your caregiver. °To make an ice pack, do one of the following: °· Place crushed ice or a   bag of frozen vegetables in a sealable plastic bag. Squeeze out the excess air. Place this bag inside another plastic bag. Slide the bag into a pillowcase or place a damp towel between your skin and the bag. °· Mix 3 parts water with 1 part rubbing alcohol. Freeze the mixture in a sealable plastic bag. When you remove the mixture from the freezer, it will be slushy. Squeeze out the excess air. Place this bag inside another plastic bag. Slide the bag into a pillowcase or place a damp towel between your skin and the bag. °SEEK MEDICAL CARE  IF: °· You develop white spots on your skin. This may give the skin a blotchy (mottled) appearance. °· Your skin turns blue or pale. °· Your skin becomes waxy or hard. °· Your swelling gets worse. °MAKE SURE YOU:  °· Understand these instructions. °· Will watch your condition. °· Will get help right away if you are not doing well or get worse. °Document Released: 04/29/2011 Document Revised: 01/17/2014 Document Reviewed: 04/29/2011 °[employer]® Patient Information ©2015 [employer]. This information is not intended to replace advice given to you by your health care provider. Make sure you discuss any questions you have with your health care provider. ° °

## 2014-06-30 ENCOUNTER — Ambulatory Visit: Payer: Self-pay | Admitting: Gynecology

## 2014-07-02 NOTE — ED Provider Notes (Signed)
Medical screening examination/treatment/procedure(s) were performed by non-physician practitioner and as supervising physician I was immediately available for consultation/collaboration.   EKG Interpretation None        Georgeann Brinkman, MD 07/02/14 1149 

## 2015-08-21 ENCOUNTER — Emergency Department
Admission: EM | Admit: 2015-08-21 | Discharge: 2015-08-21 | Disposition: A | Discharge status: Home / Self Care | Payer: Self-pay | Attending: Emergency Medicine | Admitting: Emergency Medicine

## 2015-08-21 DIAGNOSIS — J069 Acute upper respiratory infection, unspecified: Secondary | ICD-10-CM | POA: Insufficient documentation

## 2015-08-21 LAB — CBC WITH DIFFERENTIAL/PLATELET
BASOS ABS: 0 10*3/uL (ref 0–0.1)
BASOS PCT: 1 %
EOS PCT: 1 %
Eosinophils Absolute: 0.1 10*3/uL (ref 0–0.7)
HCT: 38.8 % (ref 35.0–47.0)
Hemoglobin: 13.1 g/dL (ref 12.0–16.0)
Lymphocytes Relative: 12 %
Lymphs Abs: 1 10*3/uL (ref 1.0–3.6)
MCH: 31.1 pg (ref 26.0–34.0)
MCHC: 33.7 g/dL (ref 32.0–36.0)
MCV: 92.1 fL (ref 80.0–100.0)
MONO ABS: 0.9 10*3/uL (ref 0.2–0.9)
Monocytes Relative: 11 %
NEUTROS ABS: 6.3 10*3/uL (ref 1.4–6.5)
Neutrophils Relative %: 75 %
PLATELETS: 240 10*3/uL (ref 150–440)
RBC: 4.21 MIL/uL (ref 3.80–5.20)
RDW: 12.5 % (ref 11.5–14.5)
WBC: 8.4 10*3/uL (ref 3.6–11.0)

## 2015-08-21 LAB — INFLUENZA PANEL BY PCR (TYPE A & B)

## 2015-08-21 MED ORDER — AZITHROMYCIN 250 MG PO TABS: ORAL_TABLET | ORAL | Status: DC

## 2015-08-21 MED ORDER — IBUPROFEN 800 MG PO TABS: 800.0000 mg | ORAL_TABLET | Freq: Three times a day (TID) | ORAL | Status: DC | PRN

## 2015-08-21 MED ORDER — GUAIFENESIN-CODEINE 100-10 MG/5ML PO SOLN: 10.0000 mL | ORAL | Status: DC | PRN

## 2015-08-21 NOTE — ED Notes (Signed)
Assessment per PA 

## 2015-08-21 NOTE — ED Provider Notes (Signed)
[facility] Emergency Department Provider Note  ____________________________________________  Time seen: Approximately 6:11 PM  I have reviewed the triage vital signs and the nursing notes.   HISTORY  Chief Complaint Generalized Body Aches    HPI Devin GoingMercedes Nesheiwat is a 29 y.o. female who presents for sudden onset of fever chills body aches nasal congestion and sore throat. Patient states no fever today. Feels hot and cold neck under her blanket right now.   History reviewed. No pertinent past medical history.  There are no active problems to display for this patient.   Past Surgical History  Procedure Laterality Date  . Appendectomy      Current Outpatient Rx  Name  Route  Sig  Dispense  Refill  . azithromycin (ZITHROMAX Z-PAK) 250 MG tablet      Take 2 tablets (500 mg) on  Day 1,  followed by 1 tablet (250 mg) once daily on Days 2 through 5.   6 each   0   . guaiFENesin-codeine 100-10 MG/5ML syrup   Oral   Take 10 mLs by mouth every 4 (four) hours as needed for cough.   180 mL   0   . ibuprofen (ADVIL,MOTRIN) 800 MG tablet   Oral   Take 1 tablet (800 mg total) by mouth every 8 (eight) hours as needed.   30 tablet   0     Allergies Review of patient's allergies indicates no known allergies.  History reviewed. No pertinent family history.  Social History Social History  Substance Use Topics  . Smoking status: Never Smoker   . Smokeless tobacco: None  . Alcohol Use: No    Review of Systems Constitutional: No fever/chills Eyes: No visual changes. ENT: No sore throat. Cardiovascular: Denies chest pain. Respiratory: Denies shortness of breath. Gastrointestinal: No abdominal pain.  No nausea, no vomiting.  No diarrhea.  No constipation. Genitourinary: Negative for dysuria. Musculoskeletal: Negative for back pain. Skin: Negative for rash. Neurological: Negative for headaches, focal weakness or numbness.  10-point ROS  otherwise negative.  ____________________________________________   PHYSICAL EXAM:  VITAL SIGNS: ED Triage Vitals  Enc Vitals Group     BP 08/21/15 1116 121/78 mmHg     Pulse Rate 08/21/15 1116 97     Resp 08/21/15 1116 16     Temp 08/21/15 1116 98.5 F (36.9 C)     Temp Source 08/21/15 1116 Oral     SpO2 08/21/15 1116 99 %     Weight 08/21/15 1116 105 lb (47.628 kg)     Height 08/21/15 1116 5\' 5"  (1.651 m)     Head Cir --      Peak Flow --      Pain Score 08/21/15 1117 8     Pain Loc --      Pain Edu? --      Excl. in GC? --     Constitutional: Alert and oriented. Well appearing and in no acute distress. Eyes: Conjunctivae are normal. PERRL. EOMI. Head: Atraumatic. Nose: Positive congestion/rhinnorhea. Mouth/Throat: Mucous membranes are moist.  Oropharynx non-erythematous. Neck: No stridor.   Cardiovascular: Normal rate, regular rhythm. Grossly normal heart sounds.  Good peripheral circulation. Respiratory: Normal respiratory effort.  No retractions. Lungs CTAB. Musculoskeletal: No lower extremity tenderness nor edema.  No joint effusions. Neurologic:  Normal speech and language. No gross focal neurologic deficits are appreciated. No gait instability. Skin:  Skin is warm, dry and intact. No rash noted. Psychiatric: Mood and affect are normal. Speech and  behavior are normal.  ____________________________________________   LABS (all labs ordered are listed, but only abnormal results are displayed)  Labs Reviewed  CBC WITH DIFFERENTIAL/PLATELET  INFLUENZA PANEL BY PCR (TYPE A & B, H1N1)   ____________________________________________     PROCEDURES  Procedure(s) performed: None  Critical Care performed: No  ____________________________________________   INITIAL IMPRESSION / ASSESSMENT AND PLAN / ED COURSE  Pertinent labs & imaging results that were available during my care of the patient were reviewed by me and considered in my medical decision making  (see chart for details).  Acute URI. Rx given for Zithromax, Robitussin-AC and ibuprofen 800 mg. Patient to follow up with PCP or return to the ER with any worsening symptomology. Work excuse 24 hours given, patient voices no other emergency medical complaints at this time. ____________________________________________   FINAL CLINICAL IMPRESSION(S) / ED DIAGNOSES  Final diagnoses:  URI, acute      Evangeline Dakin, PA-C 08/21/15 1813  Jene Every, MD 08/22/15 1055

## 2015-08-21 NOTE — Discharge Instructions (Signed)
Upper Respiratory Infection, Adult Most upper respiratory infections (URIs) are a viral infection of the air passages leading to the lungs. A URI affects the nose, throat, and upper air passages. The most common type of URI is nasopharyngitis and is typically referred to as "the common cold." URIs run their course and usually go away on their own. Most of the time, a URI does not require medical attention, but sometimes a bacterial infection in the upper airways can follow a viral infection. This is called a secondary infection. Sinus and middle ear infections are common types of secondary upper respiratory infections. Bacterial pneumonia can also complicate a URI. A URI can worsen asthma and chronic obstructive pulmonary disease (COPD). Sometimes, these complications can require emergency medical care and may be life threatening.  CAUSES Almost all URIs are caused by viruses. A virus is a type of germ and can spread from one person to another.  RISKS FACTORS You may be at risk for a URI if:   You smoke.   You have chronic heart or lung disease.  You have a weakened defense (immune) system.   You are very young or very old.   You have nasal allergies or asthma.  You work in crowded or poorly ventilated areas.  You work in health care facilities or schools. SIGNS AND SYMPTOMS  Symptoms typically develop 2-3 days after you come in contact with a cold virus. Most viral URIs last 7-10 days. However, viral URIs from the influenza virus (flu virus) can last 14-18 days and are typically more severe. Symptoms may include:   Runny or stuffy (congested) nose.   Sneezing.   Cough.   Sore throat.   Headache.   Fatigue.   Fever.   Loss of appetite.   Pain in your forehead, behind your eyes, and over your cheekbones (sinus pain).  Muscle aches.  DIAGNOSIS  Your health care provider may diagnose a URI by:  Physical exam.  Tests to check that your symptoms are not due to  another condition such as:  Strep throat.  Sinusitis.  Pneumonia.  Asthma. TREATMENT  A URI goes away on its own with time. It cannot be cured with medicines, but medicines may be prescribed or recommended to relieve symptoms. Medicines may help:  Reduce your fever.  Reduce your cough.  Relieve nasal congestion. HOME CARE INSTRUCTIONS   Take medicines only as directed by your health care provider.   Gargle warm saltwater or take cough drops to comfort your throat as directed by your health care provider.  Use a warm mist humidifier or inhale steam from a shower to increase air moisture. This may make it easier to breathe.  Drink enough fluid to keep your urine clear or pale yellow.   Eat soups and other clear broths and maintain good nutrition.   Rest as needed.   Return to work when your temperature has returned to normal or as your health care provider advises. You may need to stay home longer to avoid infecting others. You can also use a face mask and careful hand washing to prevent spread of the virus.  Increase the usage of your inhaler if you have asthma.   Do not use any tobacco products, including cigarettes, chewing tobacco, or electronic cigarettes. If you need help quitting, ask your health care provider. PREVENTION  The best way to protect yourself from getting a cold is to practice good hygiene.   Avoid oral or hand contact with people with cold   symptoms.   Wash your hands often if contact occurs.  There is no clear evidence that vitamin C, vitamin E, echinacea, or exercise reduces the chance of developing a cold. However, it is always recommended to get plenty of rest, exercise, and practice good nutrition.  SEEK MEDICAL CARE IF:   You are getting worse rather than better.   Your symptoms are not controlled by medicine.   You have chills.  You have worsening shortness of breath.  You have brown or red mucus.  You have yellow or brown nasal  discharge.  You have pain in your face, especially when you bend forward.  You have a fever.  You have swollen neck glands.  You have pain while swallowing.  You have white areas in the back of your throat. SEEK IMMEDIATE MEDICAL CARE IF:   You have severe or persistent:  Headache.  Ear pain.  Sinus pain.  Chest pain.  You have chronic lung disease and any of the following:  Wheezing.  Prolonged cough.  Coughing up blood.  A change in your usual mucus.  You have a stiff neck.  You have changes in your:  Vision.  Hearing.  Thinking.  Mood. MAKE SURE YOU:   Understand these instructions.  Will watch your condition.  Will get help right away if you are not doing well or get worse.   This information is not intended to replace advice given to you by your health care provider. Make sure you discuss any questions you have with your health care provider.   Document Released: 02/26/2001 Document Revised: 01/17/2015 Document Reviewed: 12/08/2013 Elsevier Interactive Patient Education 2016 [employer]  

## 2015-08-21 NOTE — ED Notes (Signed)
Patient with generalized body aches since yesterday, also with complaints of nasal congestion and sore throat.

## 2015-10-03 ENCOUNTER — Encounter: Payer: Self-pay | Admitting: Emergency Medicine

## 2015-10-03 ENCOUNTER — Emergency Department
Admission: EM | Admit: 2015-10-03 | Discharge: 2015-10-03 | Disposition: A | Discharge status: Home / Self Care | Payer: Self-pay | Attending: Emergency Medicine | Admitting: Emergency Medicine

## 2015-10-03 DIAGNOSIS — R51 Headache: Secondary | ICD-10-CM | POA: Insufficient documentation

## 2015-10-03 DIAGNOSIS — R519 Headache, unspecified: Secondary | ICD-10-CM

## 2015-10-03 DIAGNOSIS — R079 Chest pain, unspecified: Secondary | ICD-10-CM

## 2015-10-03 DIAGNOSIS — K219 Gastro-esophageal reflux disease without esophagitis: Secondary | ICD-10-CM | POA: Insufficient documentation

## 2015-10-03 DIAGNOSIS — IMO0001 Reserved for inherently not codable concepts without codable children: Secondary | ICD-10-CM

## 2015-10-03 LAB — BASIC METABOLIC PANEL
Anion gap: 4 — ABNORMAL LOW (ref 5–15)
BUN: 8 mg/dL (ref 6–20)
CALCIUM: 8.8 mg/dL — AB (ref 8.9–10.3)
CO2: 29 mmol/L (ref 22–32)
CREATININE: 0.63 mg/dL (ref 0.44–1.00)
Chloride: 107 mmol/L (ref 101–111)
GFR calc non Af Amer: >60 mL/min (ref >60)
GLUCOSE: 71 mg/dL (ref 65–99)
Potassium: 3.9 mmol/L (ref 3.5–5.1)
Sodium: 140 mmol/L (ref 135–145)

## 2015-10-03 LAB — CBC
HCT: 37.8 % (ref 35.0–47.0)
Hemoglobin: 12.5 g/dL (ref 12.0–16.0)
MCH: 29.9 pg (ref 26.0–34.0)
MCHC: 33.1 g/dL (ref 32.0–36.0)
MCV: 90.2 fL (ref 80.0–100.0)
PLATELETS: 274 10*3/uL (ref 150–440)
RBC: 4.19 MIL/uL (ref 3.80–5.20)
RDW: 13 % (ref 11.5–14.5)
WBC: 6.8 10*3/uL (ref 3.6–11.0)

## 2015-10-03 LAB — TROPONIN I

## 2015-10-03 MED ORDER — GI COCKTAIL ~~LOC~~
ORAL | Status: AC
  Administered 2015-10-03: 30 mL via ORAL
  Filled 2015-10-03: qty 30

## 2015-10-03 MED ORDER — PANTOPRAZOLE SODIUM 20 MG PO TBEC: 20.0000 mg | DELAYED_RELEASE_TABLET | Freq: Every day | ORAL | Status: DC

## 2015-10-03 MED ORDER — GI COCKTAIL ~~LOC~~
30.0000 mL | Freq: Once | ORAL | Status: AC
  Filled 2015-10-03: qty 30

## 2015-10-03 NOTE — ED Notes (Signed)
Pt hit head on cabinet a couple days ago and has had intermittent headaches since then.  No LOC with event.  Denies NVD.  When patient lays down at night c/o chest tightness and unable to go to sleep.  Denies SHOB or cough with CP.  Not made worse by movement at night.  Reports pain worse in left chest than right at night.

## 2015-10-03 NOTE — ED Notes (Signed)
Pt resting with no complaints

## 2015-10-03 NOTE — ED Provider Notes (Signed)
[facility] Emergency Department Provider Note  Time seen: 2:27 PM  I have reviewed the triage vital signs and the nursing notes.   HISTORY  Chief Complaint Headache and Chest Pain    HPI Candace Rivera is a 30 y.o. female with no past medical history who presents the emergency department with chest discomfort as well as a intermittent headache. According to the patient for the past 1 month after hitting her head she has been having intermittent headaches although denies any headache today. She also states the past several days she has been experiencing chest pain when she lies down at night. She states typically after she gets up in the morning the chest pain resolves. Has not noticed any association with food. Denies any history of gastric reflux. Denies any cardiac history. Denies shortness of breath nausea or diaphoresis. Describes the chest pain is mild.     History reviewed. No pertinent past medical history.  There are no active problems to display for this patient.   Past Surgical History  Procedure Laterality Date  . Appendectomy      Current Outpatient Rx  Name  Route  Sig  Dispense  Refill  . azithromycin (ZITHROMAX Z-PAK) 250 MG tablet      Take 2 tablets (500 mg) on  Day 1,  followed by 1 tablet (250 mg) once daily on Days 2 through 5.   6 each   0   . guaiFENesin-codeine 100-10 MG/5ML syrup   Oral   Take 10 mLs by mouth every 4 (four) hours as needed for cough.   180 mL   0   . ibuprofen (ADVIL,MOTRIN) 800 MG tablet   Oral   Take 1 tablet (800 mg total) by mouth every 8 (eight) hours as needed.   30 tablet   0     Allergies Review of patient's allergies indicates no known allergies.  History reviewed. No pertinent family history.  Social History Social History  Substance Use Topics  . Smoking status: Never Smoker   . Smokeless tobacco: None  . Alcohol Use: No    Review of Systems Constitutional: Negative for  fever. Cardiovascular: Intermittent chest pain worse at night when lying flat. Respiratory: Negative for shortness of breath. Gastrointestinal: Negative for abdominal pain, vomiting and diarrhea. Neurological: Intermittent headaches for the past one month, denies any headache currently. Denies focal weakness or numbness. 10-point ROS otherwise negative.  ____________________________________________   PHYSICAL EXAM:  VITAL SIGNS: ED Triage Vitals  Enc Vitals Group     BP 10/03/15 1208 121/78 mmHg     Pulse Rate 10/03/15 1208 55     Resp 10/03/15 1208 14     Temp 10/03/15 1208 98.2 F (36.8 C)     Temp Source 10/03/15 1208 Oral     SpO2 10/03/15 1208 100 %     Weight 10/03/15 1208 105 lb (47.628 kg)     Height 10/03/15 1208  (1.651 m)     Head Cir --      Peak Flow --      Pain Score 10/03/15 1207 7     Pain Loc --      Pain Edu? --      Excl. in GC? --     Constitutional: Alert and oriented. Well appearing and in no distress. Eyes: Normal exam ENT   Head: Normocephalic and atraumatic.   Mouth/Throat: Mucous membranes are moist. Cardiovascular: Normal rate, regular rhythm. No murmur Respiratory: Normal respiratory effort without  tachypnea nor retractions. Breath sounds are clear and equal bilaterally. No wheezes/rales/rhonchi. Gastrointestinal: Soft and nontender. No distention.  Musculoskeletal: Nontender with normal range of motion in all extremities. No lower extremity tenderness or edema. Neurologic:  Normal speech and language. No gross focal neurologic deficits  Skin:  Skin is warm, dry and intact.  Psychiatric: Mood and affect are normal. Speech and behavior are normal.  ____________________________________________    EKG  EKG reviewed and interpreted, so shows normal sinus rhythm 84 bpm, narrow QRS, normal axis, normal intervals, nonspecific ST changes. No ST elevations.  ____________________________________________      INITIAL IMPRESSION /  ASSESSMENT AND PLAN / ED COURSE  Pertinent labs & imaging results that were available during my care of the patient were reviewed by me and considered in my medical decision making (see chart for details).  EKG and labs are largely within normal limits. Patient appears very well with a normal physical exam. Denies headache currently. Does state mild epigastric pain currently. Labs are within normal limits. I discussed with the patient the likelihood of gastric reflux causing her discomfort. I discussed Protonix daily and Maalox before going to bed as well as not eating for several hours before going to bed. Patient is agreeable to try this. States she typically eats in bed and then lies down right afterwards. With normal cardiac labs, well-appearing EKG and a very well appearing exam I do not suspect ACS.  ____________________________________________   FINAL CLINICAL IMPRESSION(S) / ED DIAGNOSES  Chest pain Headaches   Minna Antis, MD 10/03/15 1430

## 2015-10-03 NOTE — Discharge Instructions (Signed)
Gastroesophageal Reflux Disease, Adult °Normally, food travels down the esophagus and stays in the stomach to be digested. However, when a person has gastroesophageal reflux disease (GERD), food and stomach acid move back up into the esophagus. When this happens, the esophagus becomes sore and inflamed. Over time, GERD can create small holes (ulcers) in the lining of the esophagus.  °CAUSES °This condition is caused by a problem with the muscle between the esophagus and the stomach (lower esophageal sphincter, or LES). Normally, the LES muscle closes after food passes through the esophagus to the stomach. When the LES is weakened or abnormal, it does not close properly, and that allows food and stomach acid to go back up into the esophagus. The LES can be weakened by certain dietary substances, medicines, and medical conditions, including: °· Tobacco use. °· Pregnancy. °· Having a hiatal hernia. °· Heavy alcohol use. °· Certain foods and beverages, such as coffee, chocolate, onions, and peppermint. °RISK FACTORS °This condition is more likely to develop in: °· People who have an increased body weight. °· People who have connective tissue disorders. °· People who use NSAID medicines. °SYMPTOMS °Symptoms of this condition include: °· Heartburn. °· Difficult or painful swallowing. °· The feeling of having a lump in the throat. °· A bitter taste in the mouth. °· Bad breath. °· Having a large amount of saliva. °· Having an upset or bloated stomach. °· Belching. °· Chest pain. °· Shortness of breath or wheezing. °· Ongoing (chronic) cough or a night-time cough. °· Wearing away of tooth enamel. °· Weight loss. °Different conditions can cause chest pain. Make sure to see your health care provider if you experience chest pain. °DIAGNOSIS °Your health care provider will take a medical history and perform a physical exam. To determine if you have mild or severe GERD, your health care provider may also monitor how you respond  to treatment. You may also have other tests, including: °· An endoscopy to examine your stomach and esophagus with a small camera. °· A test that measures the acidity level in your esophagus. °· A test that measures how much pressure is on your esophagus. °· A barium swallow or modified barium swallow to show the shape, size, and functioning of your esophagus. °TREATMENT °The goal of treatment is to help relieve your symptoms and to prevent complications. Treatment for this condition may vary depending on how severe your symptoms are. Your health care provider may recommend: °· Changes to your diet. °· Medicine. °· Surgery. °HOME CARE INSTRUCTIONS °Diet °· Follow a diet as recommended by your health care provider. This may involve avoiding foods and drinks such as: °¨ Coffee and tea (with or without caffeine). °¨ Drinks that contain alcohol. °¨ Energy drinks and sports drinks. °¨ Carbonated drinks or sodas. °¨ Chocolate and cocoa. °¨ Peppermint and mint flavorings. °¨ Garlic and onions. °¨ Horseradish. °¨ Spicy and acidic foods, including peppers, chili powder, curry powder, vinegar, hot sauces, and barbecue sauce. °¨ Citrus fruit juices and citrus fruits, such as oranges, lemons, and limes. °¨ Tomato-based foods, such as red sauce, chili, salsa, and pizza with red sauce. °¨ Fried and fatty foods, such as donuts, french fries, potato chips, and high-fat dressings. °¨ High-fat meats, such as hot dogs and fatty cuts of red and white meats, such as rib eye steak, sausage, ham, and bacon. °¨ High-fat dairy items, such as whole milk, butter, and cream cheese. °· Eat small, frequent meals instead of large meals. °· Avoid drinking large amounts of liquid with your   meals. °· Avoid eating meals during the 2-3 hours before bedtime. °· Avoid lying down right after you eat. °· Do not exercise right after you eat. ° General Instructions  °· Pay attention to any changes in your symptoms. °· Take over-the-counter and prescription  medicines only as told by your health care provider. Do not take aspirin, ibuprofen, or other NSAIDs unless your health care provider told you to do so. °· Do not use any tobacco products, including cigarettes, chewing tobacco, and e-cigarettes. If you need help quitting, ask your health care provider. °· Wear loose-fitting clothing. Do not wear anything tight around your waist that causes pressure on your abdomen. °· Raise (elevate) the head of your bed 6 inches (15cm). °· Try to reduce your stress, such as with yoga or meditation. If you need help reducing stress, ask your health care provider. °· If you are overweight, reduce your weight to an amount that is healthy for you. Ask your health care provider for guidance about a safe weight loss goal. °· Keep all follow-up visits as told by your health care provider. This is important. °SEEK MEDICAL CARE IF: °· You have new symptoms. °· You have unexplained weight loss. °· You have difficulty swallowing, or it hurts to swallow. °· You have wheezing or a persistent cough. °· Your symptoms do not improve with treatment. °· You have a hoarse voice. °SEEK IMMEDIATE MEDICAL CARE IF: °· You have pain in your arms, neck, jaw, teeth, or back. °· You feel sweaty, dizzy, or light-headed. °· You have chest pain or shortness of breath. °· You vomit and your vomit looks like blood or coffee grounds. °· You faint. °· Your stool is bloody or black. °· You cannot swallow, drink, or eat. °  °This information is not intended to replace advice given to you by your health care provider. Make sure you discuss any questions you have with your health care provider. °  °Document Released: 06/12/2005 Document Revised: 05/24/2015 Document Reviewed: 12/28/2014 °Elsevier Interactive Patient Education ©2016 [employer] °Nonspecific Chest Pain  °Chest pain can be caused by many different conditions. There is always a chance that your pain could be related to something serious, such as a heart  attack or a blood clot in your lungs. Chest pain can also be caused by conditions that are not life-threatening. If you have chest pain, it is very important to follow up with your health care provider. °CAUSES  °Chest pain can be caused by: °· Heartburn. °· Pneumonia or bronchitis. °· Anxiety or stress. °· Inflammation around your heart (pericarditis) or lung (pleuritis or pleurisy). °· A blood clot in your lung. °· A collapsed lung (pneumothorax). It can develop suddenly on its own (spontaneous pneumothorax) or from trauma to the chest. °· Shingles infection (varicella-zoster virus). °· Heart attack. °· Damage to the bones, muscles, and cartilage that make up your chest wall. This can include: °· Bruised bones due to injury. °· Strained muscles or cartilage due to frequent or repeated coughing or overwork. °· Fracture to one or more ribs. °· Sore cartilage due to inflammation (costochondritis). °RISK FACTORS  °Risk factors for chest pain may include: °· Activities that increase your risk for trauma or injury to your chest. °· Respiratory infections or conditions that cause frequent coughing. °· Medical conditions or overeating that can cause heartburn. °· Heart disease or family history of heart disease. °· Conditions or health behaviors that increase your risk of developing a blood clot. °· Having had chicken pox (  varicella zoster). °SIGNS AND SYMPTOMS °Chest pain can feel like: °· Burning or tingling on the surface of your chest or deep in your chest. °· Crushing, pressure, aching, or squeezing pain. °· Dull or sharp pain that is worse when you move, cough, or take a deep breath. °· Pain that is also felt in your back, neck, shoulder, or arm, or pain that spreads to any of these areas. °Your chest pain may come and go, or it may stay constant. °DIAGNOSIS °Lab tests or other studies may be needed to find the cause of your pain. Your health care provider may have you take a test called an ambulatory ECG  (electrocardiogram). An ECG records your heartbeat patterns at the time the test is performed. You may also have other tests, such as: °· Transthoracic echocardiogram (TTE). During echocardiography, sound waves are used to create a picture of all of the heart structures and to look at how blood flows through your heart. °· Transesophageal echocardiogram (TEE). This is a more advanced imaging test that obtains images from inside your body. It allows your health care provider to see your heart in finer detail. °· Cardiac monitoring. This allows your health care provider to monitor your heart rate and rhythm in real time. °· Holter monitor. This is a portable device that records your heartbeat and can help to diagnose abnormal heartbeats. It allows your health care provider to track your heart activity for several days, if needed. °· Stress tests. These can be done through exercise or by taking medicine that makes your heart beat more quickly. °· Blood tests. °· Imaging tests. °TREATMENT  °Your treatment depends on what is causing your chest pain. Treatment may include: °· Medicines. These may include: °· Acid blockers for heartburn. °· Anti-inflammatory medicine. °· Pain medicine for inflammatory conditions. °· Antibiotic medicine, if an infection is present. °· Medicines to dissolve blood clots. °· Medicines to treat coronary artery disease. °· Supportive care for conditions that do not require medicines. This may include: °· Resting. °· Applying heat or cold packs to injured areas. °· Limiting activities until pain decreases. °HOME CARE INSTRUCTIONS °· If you were prescribed an antibiotic medicine, finish it all even if you start to feel better. °· Avoid any activities that bring on chest pain. °· Do not use any tobacco products, including cigarettes, chewing tobacco, or electronic cigarettes. If you need help quitting, ask your health care provider. °· Do not drink alcohol. °· Take medicines only as directed by  your health care provider. °· Keep all follow-up visits as directed by your health care provider. This is important. This includes any further testing if your chest pain does not go away. °· If heartburn is the cause for your chest pain, you may be told to keep your head raised (elevated) while sleeping. This reduces the chance that acid will go from your stomach into your esophagus. °· Make lifestyle changes as directed by your health care provider. These may include: °¨ Getting regular exercise. Ask your health care provider to suggest some activities that are safe for you. °¨ Eating a heart-healthy diet. A registered dietitian can help you to learn healthy eating options. °¨ Maintaining a healthy weight. °¨ Managing diabetes, if necessary. °¨ Reducing stress. °SEEK MEDICAL CARE IF: °· Your chest pain does not go away after treatment. °· You have a rash with blisters on your chest. °· You have a fever. °SEEK IMMEDIATE MEDICAL CARE IF:  °· Your chest pain is worse. °· You   have an increasing cough, or you cough up blood. °· You have severe abdominal pain. °· You have severe weakness. °· You faint. °· You have chills. °· You have sudden, unexplained chest discomfort. °· You have sudden, unexplained discomfort in your arms, back, neck, or jaw. °· You have shortness of breath at any time. °· You suddenly start to sweat, or your skin gets clammy. °· You feel nauseous or you vomit. °· You suddenly feel light-headed or dizzy. °· Your heart begins to beat quickly, or it feels like it is skipping beats. °These symptoms may represent a serious problem that is an emergency. Do not wait to see if the symptoms will go away. Get medical help right away. Call your local emergency services (911 in the [place] Do not drive yourself to the hospital. °  °This information is not intended to replace advice given to you by your health care provider. Make sure you discuss any questions you have with your health care provider. °  °Document  Released: 06/12/2005 Document Revised: 09/23/2014 Document Reviewed: 04/08/2014 °Elsevier Interactive Patient Education ©2016 [employer] ° °

## 2015-10-03 NOTE — ED Notes (Signed)
Family at bedside. 

## 2016-01-25 ENCOUNTER — Emergency Department
Admission: EM | Admit: 2016-01-25 | Discharge: 2016-01-25 | Disposition: A | Discharge status: Home / Self Care | Payer: No Typology Code available for payment source | Attending: Emergency Medicine | Admitting: Emergency Medicine

## 2016-01-25 ENCOUNTER — Encounter: Payer: Self-pay | Admitting: Emergency Medicine

## 2016-01-25 DIAGNOSIS — G4700 Insomnia, unspecified: Secondary | ICD-10-CM | POA: Insufficient documentation

## 2016-01-25 DIAGNOSIS — Z791 Long term (current) use of non-steroidal anti-inflammatories (NSAID): Secondary | ICD-10-CM | POA: Insufficient documentation

## 2016-01-25 DIAGNOSIS — F419 Anxiety disorder, unspecified: Secondary | ICD-10-CM

## 2016-01-25 LAB — GLUCOSE, CAPILLARY: Glucose-Capillary: 75 mg/dL (ref 65–99)

## 2016-01-25 MED ORDER — DIAZEPAM 5 MG PO TABS
10.0000 mg | ORAL_TABLET | Freq: Once | ORAL | Status: AC
  Administered 2016-01-25: 10 mg via ORAL
  Filled 2016-01-25: qty 2

## 2016-01-25 MED ORDER — LORAZEPAM 0.5 MG PO TABS: 0.5000 mg | ORAL_TABLET | Freq: Three times a day (TID) | ORAL | Status: AC | PRN

## 2016-01-25 NOTE — ED Provider Notes (Signed)
[facility] Emergency Department Provider Note        Time seen: ----------------------------------------- 10:16 PM on 01/25/2016 -----------------------------------------    I have reviewed the triage vital signs and the nursing notes.   HISTORY  Chief Complaint Insomnia    HPI Candace Rivera is a 30 y.o. female who presents ER after not sleeping well for the last week. She's been unable to eat for the last 3 days and is also had a headache. She states prior to last week she slept normally. She is taking sleeping medicines which are only help her sleep 4-5 hours at a time. She does report increased stress recently.She denies any recent illness.   History reviewed. No pertinent past medical history.  There are no active problems to display for this patient.   Past Surgical History  Procedure Laterality Date  . Appendectomy      Allergies Review of patient's allergies indicates no known allergies.  Social History Social History  Substance Use Topics  . Smoking status: Never Smoker   . Smokeless tobacco: None  . Alcohol Use: No    Review of Systems Constitutional: Negative for fever. Eyes: Negative for visual changes. ENT: Negative for sore throat. Cardiovascular: Negative for chest pain. Respiratory: Negative for shortness of breath. Gastrointestinal: Negative for abdominal pain, vomiting and diarrhea. Genitourinary: Negative for dysuria. Musculoskeletal: Negative for back pain. Skin: Negative for rash. Neurological: Positive for headache Psychiatric: Positive for anxiety  10-point ROS otherwise negative.  ____________________________________________   PHYSICAL EXAM:  VITAL SIGNS: ED Triage Vitals  Enc Vitals Group     BP 01/25/16 2154 114/86 mmHg     Pulse Rate 01/25/16 2154 91     Resp 01/25/16 2154 18     Temp 01/25/16 2154 98.2 F (36.8 C)     Temp src --      SpO2 01/25/16 2154 100 %     Weight 01/25/16 2154 100  lb (45.36 kg)     Height 01/25/16 2154 5\' 5"  (1.651 m)     Head Cir --      Peak Flow --      Pain Score 01/25/16 2154 5     Pain Loc --      Pain Edu? --      Excl. in GC? --     Constitutional: Alert and oriented. Well appearing and in no distress. Eyes: Conjunctivae are normal. PERRL. Normal extraocular movements. ENT   Head: Normocephalic and atraumatic.   Nose: No congestion/rhinnorhea.   Mouth/Throat: Mucous membranes are moist.   Neck: No stridor. Cardiovascular: Normal rate, regular rhythm. No murmurs, rubs, or gallops. Respiratory: Normal respiratory effort without tachypnea nor retractions. Breath sounds are clear and equal bilaterally. No wheezes/rales/rhonchi. Gastrointestinal: Soft and nontender. Normal bowel sounds Musculoskeletal: Nontender with normal range of motion in all extremities. No lower extremity tenderness nor edema. Neurologic:  Normal speech and language. No gross focal neurologic deficits are appreciated.  Skin:  Skin is warm, dry and intact. No rash noted. Psychiatric: Anxious mood and affect  ____________________________________________  ED COURSE:  Pertinent labs & imaging results that were available during my care of the patient were reviewed by me and considered in my medical decision making (see chart for details). Patient is to ER with insomnia likely secondary to anxiety. ____________________________________________    LABS (pertinent positives/negatives)  Labs Reviewed  GLUCOSE, CAPILLARY  CBG MONITORING, ED    ____________________________________________  FINAL ASSESSMENT AND PLAN  Anxiety, insomnia  Plan: Patient with  labs as dictated above. I will encourage Tylenol PM or Advil PM to help aid her sleep. She is also having anxiety, I will prescribe Ativan for her to use as needed.   Emily Filbert, MD   Note: This dictation was prepared with Dragon dictation. Any transcriptional errors that result from this  process are unintentional   Emily Filbert, MD 01/25/16 2239

## 2016-01-25 NOTE — Discharge Instructions (Signed)
Panic Attacks °Panic attacks are sudden, short-lived surges of severe anxiety, fear, or discomfort. They may occur for no reason when you are relaxed, when you are anxious, or when you are sleeping. Panic attacks may occur for a number of reasons:  °· Healthy people occasionally have panic attacks in extreme, life-threatening situations, such as war or natural disasters. Normal anxiety is a protective mechanism of the body that helps us react to danger (fight or flight response). °· Panic attacks are often seen with anxiety disorders, such as panic disorder, social anxiety disorder, generalized anxiety disorder, and phobias. Anxiety disorders cause excessive or uncontrollable anxiety. They may interfere with your relationships or other life activities. °· Panic attacks are sometimes seen with other mental illnesses, such as depression and posttraumatic stress disorder. °· Certain medical conditions, prescription medicines, and drugs of abuse can cause panic attacks. °SYMPTOMS  °Panic attacks start suddenly, peak within 20 minutes, and are accompanied by four or more of the following symptoms: °· Pounding heart or fast heart rate (palpitations). °· Sweating. °· Trembling or shaking. °· Shortness of breath or feeling smothered. °· Feeling choked. °· Chest pain or discomfort. °· Nausea or strange feeling in your stomach. °· Dizziness, light-headedness, or feeling like you will faint. °· Chills or hot flushes. °· Numbness or tingling in your lips or hands and feet. °· Feeling that things are not real or feeling that you are not yourself. °· Fear of losing control or going crazy. °· Fear of dying. °Some of these symptoms can mimic serious medical conditions. For example, you may think you are having a heart attack. Although panic attacks can be very scary, they are not life threatening. °DIAGNOSIS  °Panic attacks are diagnosed through an assessment by your health care provider. Your health care provider will ask  questions about your symptoms, such as where and when they occurred. Your health care provider will also ask about your medical history and use of alcohol and drugs, including prescription medicines. Your health care provider may order blood tests or other studies to rule out a serious medical condition. Your health care provider may refer you to a mental health professional for further evaluation. °TREATMENT  °· Most healthy people who have one or two panic attacks in an extreme, life-threatening situation will not require treatment. °· The treatment for panic attacks associated with anxiety disorders or other mental illness typically involves counseling with a mental health professional, medicine, or a combination of both. Your health care provider will help determine what treatment is best for you. °· Panic attacks due to physical illness usually go away with treatment of the illness. If prescription medicine is causing panic attacks, talk with your health care provider about stopping the medicine, decreasing the dose, or substituting another medicine. °· Panic attacks due to alcohol or drug abuse go away with abstinence. Some adults need professional help in order to stop drinking or using drugs. °HOME CARE INSTRUCTIONS  °· Take all medicines as directed by your health care provider.   °· Schedule and attend follow-up visits as directed by your health care provider. It is important to keep all your appointments. °SEEK MEDICAL CARE IF: °· You are not able to take your medicines as prescribed. °· Your symptoms do not improve or get worse. °SEEK IMMEDIATE MEDICAL CARE IF:  °· You experience panic attack symptoms that are different than your usual symptoms. °· You have serious thoughts about hurting yourself or others. °· You are taking medicine for panic attacks and   have a serious side effect. °MAKE SURE YOU: °· Understand these instructions. °· Will watch your condition. °· Will get help right away if you are not  doing well or get worse. °  °This information is not intended to replace advice given to you by your health care provider. Make sure you discuss any questions you have with your health care provider. °  °Document Released: 09/02/2005 Document Revised: 09/07/2013 Document Reviewed: 04/16/2013 °Elsevier Interactive Patient Education ©2016 Elsevier Inc. °Insomnia °Insomnia is a sleep disorder that makes it difficult to fall asleep or to stay asleep. Insomnia can cause tiredness (fatigue), low energy, difficulty concentrating, mood swings, and poor performance at work or school.  °There are three different ways to classify insomnia:  °· Difficulty falling asleep. °· Difficulty staying asleep. °· Waking up too early in the morning. °Any type of insomnia can be long-term (chronic) or short-term (acute). Both are common. Short-term insomnia usually lasts for three months or less. Chronic insomnia occurs at least three times a week for longer than three months. °CAUSES  °Insomnia may be caused by another condition, situation, or substance, such as: °· Anxiety. °· Certain medicines. °· Gastroesophageal reflux disease (GERD) or other gastrointestinal conditions. °· Asthma or other breathing conditions. °· Restless legs syndrome, sleep apnea, or other sleep disorders. °· Chronic pain. °· Menopause. This may include hot flashes. °· Stroke. °· Abuse of alcohol, tobacco, or illegal drugs. °· Depression. °· Caffeine.   °· Neurological disorders, such as Alzheimer disease. °· An overactive thyroid (hyperthyroidism). °The cause of insomnia may not be known. °RISK FACTORS °Risk factors for insomnia include: °· Gender. Women are more commonly affected than men. °· Age. Insomnia is more common as you get older. °· Stress. This may involve your professional or personal life. °· Income. Insomnia is more common in people with lower income. °· Lack of exercise.   °· Irregular work schedule or night shifts. °· Traveling between different  time zones. °SIGNS AND SYMPTOMS °If you have insomnia, trouble falling asleep or trouble staying asleep is the main symptom. This may lead to other symptoms, such as: °· Feeling fatigued. °· Feeling nervous about going to sleep. °· Not feeling rested in the morning. °· Having trouble concentrating. °· Feeling irritable, anxious, or depressed. °TREATMENT  °Treatment for insomnia depends on the cause. If your insomnia is caused by an underlying condition, treatment will focus on addressing the condition. Treatment may also include:  °· Medicines to help you sleep. °· Counseling or therapy. °· Lifestyle adjustments. °HOME CARE INSTRUCTIONS  °· Take medicines only as directed by your health care provider. °· Keep regular sleeping and waking hours. Avoid naps. °· Keep a sleep diary to help you and your health care provider figure out what could be causing your insomnia. Include:   °¨ When you sleep. °¨ When you wake up during the night. °¨ How well you sleep.   °¨ How rested you feel the next day. °¨ Any side effects of medicines you are taking. °¨ What you eat and drink.   °· Make your bedroom a comfortable place where it is easy to fall asleep: °¨ Put up shades or special blackout curtains to block light from outside. °¨ Use a white noise machine to block noise. °¨ Keep the temperature cool.   °· Exercise regularly as directed by your health care provider. Avoid exercising right before bedtime. °· Use relaxation techniques to manage stress. Ask your health care provider to suggest some techniques that may work well for you. These may   may include:  Breathing exercises.  Routines to release muscle tension.  Visualizing peaceful scenes.  Cut back on alcohol, caffeinated beverages, and cigarettes, especially close to bedtime. These can disrupt your sleep.  Do not overeat or eat spicy foods right before bedtime. This can lead to digestive discomfort that can make it hard for you to sleep.  Limit screen use before  bedtime. This includes:  Watching TV.  Using your smartphone, tablet, and computer.  Stick to a routine. This can help you fall asleep faster. Try to do a quiet activity, brush your teeth, and go to bed at the same time each night.  Get out of bed if you are still awake after 15 minutes of trying to sleep. Keep the lights down, but try reading or doing a quiet activity. When you feel sleepy, go back to bed.  Make sure that you drive carefully. Avoid driving if you feel very sleepy.  Keep all follow-up appointments as directed by your health care provider. This is important. SEEK MEDICAL CARE IF:   You are tired throughout the day or have trouble in your daily routine due to sleepiness.  You continue to have sleep problems or your sleep problems get worse. SEEK IMMEDIATE MEDICAL CARE IF:   You have serious thoughts about hurting yourself or someone else.   This information is not intended to replace advice given to you by your health care provider. Make sure you discuss any questions you have with your health care provider.   Document Released: 08/30/2000 Document Revised: 05/24/2015 Document Reviewed: 06/03/2014 Elsevier Interactive Patient Education [employer]2016 Elsevier Inc.

## 2016-01-25 NOTE — ED Notes (Signed)
Discussed discharge instructions, prescriptions, and follow-up care with patient. No questions or concerns at this time. Pt stable at discharge.  

## 2016-01-25 NOTE — ED Notes (Signed)
Pt to ER with c/o not sleeping for last week, unable to eat for last 3 days and headache.

## 2016-03-01 ENCOUNTER — Encounter: Payer: Self-pay | Admitting: Emergency Medicine

## 2016-03-01 ENCOUNTER — Emergency Department
Admission: EM | Admit: 2016-03-01 | Discharge: 2016-03-01 | Disposition: A | Discharge status: Home / Self Care | Payer: No Typology Code available for payment source | Attending: Emergency Medicine | Admitting: Emergency Medicine

## 2016-03-01 DIAGNOSIS — Z792 Long term (current) use of antibiotics: Secondary | ICD-10-CM | POA: Insufficient documentation

## 2016-03-01 DIAGNOSIS — G44209 Tension-type headache, unspecified, not intractable: Secondary | ICD-10-CM | POA: Insufficient documentation

## 2016-03-01 DIAGNOSIS — Z79899 Other long term (current) drug therapy: Secondary | ICD-10-CM | POA: Insufficient documentation

## 2016-03-01 MED ORDER — METHYLPREDNISOLONE SODIUM SUCC 40 MG IJ SOLR
40.0000 mg | Freq: Once | INTRAMUSCULAR | Status: AC
  Administered 2016-03-01: 40 mg via INTRAMUSCULAR
  Filled 2016-03-01: qty 1

## 2016-03-01 MED ORDER — CYCLOBENZAPRINE HCL 10 MG PO TABS
5.0000 mg | ORAL_TABLET | Freq: Once | ORAL | Status: AC
  Administered 2016-03-01: 5 mg via ORAL
  Filled 2016-03-01: qty 1

## 2016-03-01 MED ORDER — PREDNISONE 10 MG (21) PO TBPK: ORAL_TABLET | ORAL | Status: DC

## 2016-03-01 MED ORDER — CYCLOBENZAPRINE HCL 10 MG PO TABS: 10.0000 mg | ORAL_TABLET | Freq: Three times a day (TID) | ORAL | Status: DC | PRN

## 2016-03-01 NOTE — ED Provider Notes (Signed)
[facility] Emergency Department Provider Note  ____________________________________________  Time seen: Approximately 7:03 PM  I have reviewed the triage vital signs and the nursing notes.   HISTORY  Chief Complaint Migraine    HPI Candace Rivera is a 30 y.o. female who first several concerns. Had a headache on this week. Mostly frontal. No congestion, fevers or chills. No congestion or sore throat. No ear pain. She does have neck pain and tension. Doesn't sleep well at night. Caffeine in the evenings. Works at [employer]. No rash.    History reviewed. No pertinent past medical history.  There are no active problems to display for this patient.   Past Surgical History  Procedure Laterality Date  . Appendectomy      Current Outpatient Rx  Name  Route  Sig  Dispense  Refill  . azithromycin (ZITHROMAX Z-PAK) 250 MG tablet      Take 2 tablets (500 mg) on  Day 1,  followed by 1 tablet (250 mg) once daily on Days 2 through 5.   6 each   0   . cyclobenzaprine (FLEXERIL) 10 MG tablet   Oral   Take 1 tablet (10 mg total) by mouth every 8 (eight) hours as needed for muscle spasms.   21 tablet   0   . guaiFENesin-codeine 100-10 MG/5ML syrup   Oral   Take 10 mLs by mouth every 4 (four) hours as needed for cough.   180 mL   0   . ibuprofen (ADVIL,MOTRIN) 800 MG tablet   Oral   Take 1 tablet (800 mg total) by mouth every 8 (eight) hours as needed.   30 tablet   0   . LORazepam (ATIVAN) 0.5 MG tablet   Oral   Take 1 tablet (0.5 mg total) by mouth every 8 (eight) hours as needed for anxiety.   20 tablet   0   . pantoprazole (PROTONIX) 20 MG tablet   Oral   Take 1 tablet (20 mg total) by mouth daily.   30 tablet   1   . predniSONE (STERAPRED UNI-PAK 21 TAB) 10 MG (21) TBPK tablet      6 tablets on day 1, 5 tablets on day 2, 4 tablets on day 3, etc...   21 tablet   0     Allergies Review of patient's allergies indicates no known  allergies.  No family history on file.  Social History Social History  Substance Use Topics  . Smoking status: Never Smoker   . Smokeless tobacco: None  . Alcohol Use: No    Review of Systems Constitutional: No fever/chills Eyes: No visual changes. ENT: No sore throat. Cardiovascular: Denies chest pain. Respiratory: Denies shortness of breath. Gastrointestinal: No abdominal pain.  No nausea, no vomiting.  No diarrhea.  No constipation. Genitourinary: Negative for dysuria. Musculoskeletal: Negative for back pain. Skin: Negative for rash. Neurological: Negative for  focal weakness or numbness. 10-point ROS otherwise negative.  ____________________________________________   PHYSICAL EXAM:  VITAL SIGNS: ED Triage Vitals  Enc Vitals Group     BP 03/01/16 1833 119/78 mmHg     Pulse Rate 03/01/16 1833 89     Resp 03/01/16 1833 18     Temp 03/01/16 1833 98.9 F (37.2 C)     Temp Source 03/01/16 1833 Oral     SpO2 03/01/16 1833 100 %     Weight 03/01/16 1829 95 lb (43.092 kg)     Height 03/01/16 1829 5\' 5"  (1.651 m)  Head Cir --      Peak Flow --      Pain Score 03/01/16 1829 7     Pain Loc --      Pain Edu? --      Excl. in GC? --     Constitutional: Alert and oriented. Well appearing and in no acute distress. Eyes: Conjunctivae are normal. PERRL. EOMI. Ears:  Clear with normal landmarks. No erythema. Head: Atraumatic. Nose: No congestion/rhinnorhea. Mouth/Throat: Mucous membranes are moist.  Oropharynx non-erythematous. No lesions. Neck:  Supple.  No adenopathy.  Paracervical tenderness bilaterally. Cardiovascular: Normal rate, regular rhythm. Grossly normal heart sounds.  Good peripheral circulation. Respiratory: Normal respiratory effort.  No retractions. Lungs CTAB. Musculoskeletal: Nml ROM of upper and lower extremity joints. Neurologic:  Normal speech and language. No gross focal neurologic deficits are appreciated. No gait instability. Skin:  Skin is  warm, dry and intact. No rash noted. Psychiatric: Mood and affect are normal. Speech and behavior are normal.  ____________________________________________   LABS (all labs ordered are listed, but only abnormal results are displayed)  Labs Reviewed - No data to display ____________________________________________  EKG   ____________________________________________  RADIOLOGY   ____________________________________________   PROCEDURES  Procedure(s) performed: None  Critical Care performed: No  ____________________________________________   INITIAL IMPRESSION / ASSESSMENT AND PLAN / ED COURSE  Pertinent labs & imaging results that were available during my care of the patient were reviewed by me and considered in my medical decision making (see chart for details).  30 year old with persistent headache this week. Also complains of insomnia. Suspected tension type headache. Given Solu-Medrol in the emergency room. Also Flexeril. May begin a prednisone taper if symptoms don't improve.  Also encouraged her to cut out caffeine in the evenings as this should help her insomnia. ____________________________________________   FINAL CLINICAL IMPRESSION(S) / ED DIAGNOSES  Final diagnoses:  Tension-type headache, not intractable, unspecified chronicity pattern      Ignacia Bayley, PA-C 03/01/16 1906  Sharman Cheek, MD 03/02/16 [phone]

## 2016-03-01 NOTE — ED Notes (Signed)
Migraine since Monday, pain eases with ibuprofen but pain does not go away, pt states she has not been sleeping.

## 2016-03-01 NOTE — Discharge Instructions (Signed)
Tension Headache °A tension headache is a feeling of pain, pressure, or aching that is often felt over the front and sides of the head. The pain can be dull, or it can feel tight (constricting). Tension headaches are not normally associated with nausea or vomiting, and they do not get worse with physical activity. Tension headaches can last from 30 minutes to several days. This is the most common type of headache. °CAUSES °The exact cause of this condition is not known. Tension headaches often begin after stress, anxiety, or depression. Other triggers may include: °· Alcohol. °· Too much caffeine, or caffeine withdrawal. °· Respiratory infections, such as colds, flu, or sinus infections. °· Dental problems or teeth clenching. °· Fatigue. °· Holding your head and neck in the same position for a long period of time, such as while using a computer. °· Smoking. °SYMPTOMS °Symptoms of this condition include: °· A feeling of pressure around the head. °· Dull, aching head pain. °· Pain felt over the front and sides of the head. °· Tenderness in the muscles of the head, neck, and shoulders. °DIAGNOSIS °This condition may be diagnosed based on your symptoms and a physical exam. Tests may be done, such as a CT scan or an MRI of your head. These tests may be done if your symptoms are severe or unusual. °TREATMENT °This condition may be treated with lifestyle changes and medicines to help relieve symptoms. °HOME CARE INSTRUCTIONS °Managing Pain °· Take over-the-counter and prescription medicines only as told by your health care provider. °· Lie down in a dark, quiet room when you have a headache. °· If directed, apply ice to the head and neck area: °¨ Put ice in a plastic bag. °¨ Place a towel between your skin and the bag. °¨ Leave the ice on for 20 minutes, 2-3 times per day. °· Use a heating pad or a hot shower to apply heat to the head and neck area as told by your health care provider. °Eating and Drinking °· Eat meals on  a regular schedule. °· Limit alcohol use. °· Decrease your caffeine intake, or stop using caffeine. °General Instructions °· Keep all follow-up visits as told by your health care provider. This is important. °· Keep a headache journal to help find out what may trigger your headaches. For example, write down: °¨ What you eat and drink. °¨ How much sleep you get. °¨ Any change to your diet or medicines. °· Try massage or other relaxation techniques. °· Limit stress. °· Sit up straight, and avoid tensing your muscles. °· Do not use tobacco products, including cigarettes, chewing tobacco, or e-cigarettes. If you need help quitting, ask your health care provider. °· Exercise regularly as told by your health care provider. °· Get 7-9 hours of sleep, or the amount recommended by your health care provider. °SEEK MEDICAL CARE IF: °· Your symptoms are not helped by medicine. °· You have a headache that is different from what you normally experience. °· You have nausea or you vomit. °· You have a fever. °SEEK IMMEDIATE MEDICAL CARE IF: °· Your headache becomes severe. °· You have repeated vomiting. °· You have a stiff neck. °· You have a loss of vision. °· You have problems with speech. °· You have pain in your eye or ear. °· You have muscular weakness or loss of muscle control. °· You lose your balance or you have trouble walking. °· You feel faint or you pass out. °· You have confusion. °  °  This information is not intended to replace advice given to you by your health care provider. Make sure you discuss any questions you have with your health care provider.   Document Released: 09/02/2005 Document Revised: 05/24/2015 Document Reviewed: 12/26/2014 Elsevier Interactive Patient Education 2016 [employer]   You may begin a prednisone taper tomorrow if headache is not resolved. Continue muscle relaxants as well. Try to cut out caffeine in the evenings and this should help your sleeping.

## 2016-05-05 ENCOUNTER — Encounter: Payer: Self-pay | Admitting: Emergency Medicine

## 2016-05-05 ENCOUNTER — Emergency Department
Admission: EM | Admit: 2016-05-05 | Discharge: 2016-05-05 | Disposition: A | Discharge status: Home / Self Care | Payer: Self-pay | Attending: Emergency Medicine | Admitting: Emergency Medicine

## 2016-05-05 DIAGNOSIS — Z792 Long term (current) use of antibiotics: Secondary | ICD-10-CM | POA: Insufficient documentation

## 2016-05-05 DIAGNOSIS — N390 Urinary tract infection, site not specified: Secondary | ICD-10-CM | POA: Insufficient documentation

## 2016-05-05 DIAGNOSIS — Z79899 Other long term (current) drug therapy: Secondary | ICD-10-CM | POA: Insufficient documentation

## 2016-05-05 DIAGNOSIS — Z791 Long term (current) use of non-steroidal anti-inflammatories (NSAID): Secondary | ICD-10-CM | POA: Insufficient documentation

## 2016-05-05 LAB — URINALYSIS COMPLETE WITH MICROSCOPIC (ARMC ONLY)
BILIRUBIN URINE: NEGATIVE
Glucose, UA: NEGATIVE mg/dL
Hgb urine dipstick: NEGATIVE
Ketones, ur: NEGATIVE mg/dL
Nitrite: POSITIVE — AB
PH: 7 (ref 5.0–8.0)
PROTEIN: 30 mg/dL — AB
Specific Gravity, Urine: 1.024 (ref 1.005–1.030)

## 2016-05-05 LAB — POCT PREGNANCY, URINE: PREG TEST UR: NEGATIVE

## 2016-05-05 MED ORDER — CEPHALEXIN 500 MG PO CAPS
500.0000 mg | ORAL_CAPSULE | Freq: Once | ORAL | Status: AC
  Administered 2016-05-05: 500 mg via ORAL

## 2016-05-05 MED ORDER — CEPHALEXIN 500 MG PO CAPS
ORAL_CAPSULE | ORAL | Status: AC
  Filled 2016-05-05: qty 1

## 2016-05-05 MED ORDER — CEPHALEXIN 500 MG PO CAPS
500.0000 mg | ORAL_CAPSULE | Freq: Two times a day (BID) | ORAL | 0 refills | Status: DC
Start: 2016-05-05 — End: 2017-10-16

## 2016-05-05 NOTE — ED Provider Notes (Signed)
[facility] Emergency Department Provider Note   ____________________________________________    I have reviewed the triage vital signs and the nursing notes.   HISTORY  Chief Complaint Dysuria     HPI Candace Rivera is a 30 y.o. female who reports mild dysuria and bladder discomfort over the last week. Patient reports this is especially noticeable at night when she is lying down to try to sleep. She denies back pain, nausea, vomiting, fever, chills. She has never had this before. She denies vaginal discharge.    History reviewed. No pertinent past medical history.  There are no active problems to display for this patient.   Past Surgical History:  Procedure Laterality Date  . APPENDECTOMY      Prior to Admission medications   Medication Sig Start Date End Date Taking? Authorizing Provider  azithromycin (ZITHROMAX Z-PAK) 250 MG tablet Take 2 tablets (500 mg) on  Day 1,  followed by 1 tablet (250 mg) once daily on Days 2 through 5. 08/21/15   Evangeline Dakinharles M Beers, PA-C  cephALEXin (KEFLEX) 500 MG capsule Take 1 capsule (500 mg total) by mouth 2 (two) times daily. 05/05/16   Jene Everyobert Rockland Kotarski, MD  cyclobenzaprine (FLEXERIL) 10 MG tablet Take 1 tablet (10 mg total) by mouth every 8 (eight) hours as needed for muscle spasms. 03/01/16   Ignacia Bayleyobert Tumey, PA-C  guaiFENesin-codeine 100-10 MG/5ML syrup Take 10 mLs by mouth every 4 (four) hours as needed for cough. 08/21/15   Charmayne Sheerharles M Beers, PA-C  ibuprofen (ADVIL,MOTRIN) 800 MG tablet Take 1 tablet (800 mg total) by mouth every 8 (eight) hours as needed. 08/21/15   Charmayne Sheerharles M Beers, PA-C  LORazepam (ATIVAN) 0.5 MG tablet Take 1 tablet (0.5 mg total) by mouth every 8 (eight) hours as needed for anxiety. 01/25/16 01/24/17  Emily FilbertJonathan E Williams, MD  pantoprazole (PROTONIX) 20 MG tablet Take 1 tablet (20 mg total) by mouth daily. 10/03/15 10/02/16  Minna AntisKevin Paduchowski, MD  predniSONE (STERAPRED UNI-PAK 21 TAB) 10 MG (21) TBPK  tablet 6 tablets on day 1, 5 tablets on day 2, 4 tablets on day 3, etc... 03/01/16   Ignacia Bayleyobert Tumey, PA-C     Allergies Review of patient's allergies indicates no known allergies.  History reviewed. No pertinent family history.  Social History Social History  Substance Use Topics  . Smoking status: Never Smoker  . Smokeless tobacco: Never Used  . Alcohol use No    Review of Systems  Constitutional: No fever/chills     Gastrointestinal:No nausea, no vomiting.   Genitourinary: As above, no vaginal discharge Musculoskeletal: Negative for back pain.     ____________________________________________   PHYSICAL EXAM:  VITAL SIGNS: ED Triage Vitals [05/05/16 2227]  Enc Vitals Group     BP 122/78     Pulse Rate 90     Resp 16     Temp 98 F (36.7 C)     Temp src      SpO2 100 %     Weight 104 lb (47.2 kg)     Height 5\' 4"  (1.626 m)     Head Circumference      Peak Flow      Pain Score 6     Pain Loc      Pain Edu?      Excl. in GC?      Constitutional: Alert and oriented. No acute distress. Pleasant and interactive Abdominal: No suprapubic tenderness to palpation, no CVA tenderness Cardiovascular: Normal rate, regular rhythm.  Respiratory: Normal respiratory effort.  No retractions. Genitourinary: deferred  Neurologic:  Normal speech and language. No gross focal neurologic deficits are appreciated.   Skin:  Skin is warm, dry and intact. No rash noted.   ____________________________________________   LABS (all labs ordered are listed, but only abnormal results are displayed)  Labs Reviewed  URINALYSIS COMPLETEWITH MICROSCOPIC ([facility] ONLY) - Abnormal; Notable for the following:       Result Value   Color, Urine YELLOW (*)    APPearance HAZY (*)    Protein, ur 30 (*)    Nitrite POSITIVE (*)    Leukocytes, UA TRACE (*)    Bacteria, UA MANY (*)    Squamous Epithelial / LPF 0-5 (*)    All other components within normal limits  POC URINE PREG, ED  POCT  PREGNANCY, URINE   ____________________________________________  EKG   ____________________________________________  RADIOLOGY  None ____________________________________________   PROCEDURES  Procedure(s) performed: No    Critical Care performed: No ____________________________________________   INITIAL IMPRESSION / ASSESSMENT AND PLAN / ED COURSE  Pertinent labs & imaging results that were available during my care of the patient were reviewed by me and considered in my medical decision making (see chart for details).  History of present illness an urinalysis consistent with UTI. Keflex by mouth given in the emergency department. Rx provided. Return precautions discussed.   ____________________________________________   FINAL CLINICAL IMPRESSION(S) / ED DIAGNOSES  Final diagnoses:  UTI (lower urinary tract infection)      NEW MEDICATIONS STARTED DURING THIS VISIT:  New Prescriptions   CEPHALEXIN (KEFLEX) 500 MG CAPSULE    Take 1 capsule (500 mg total) by mouth 2 (two) times daily.     Note:  This document was prepared using Dragon voice recognition software and may include unintentional dictation errors.    Jene Everyobert Charita Lindenberger, MD 05/05/16 2257

## 2016-05-05 NOTE — ED Triage Notes (Signed)
States "uncomfortable" urinating, pains in low pelvic region, x 1 week

## 2016-05-12 ENCOUNTER — Encounter: Payer: Self-pay | Admitting: Emergency Medicine

## 2016-05-12 ENCOUNTER — Emergency Department
Admission: EM | Admit: 2016-05-12 | Discharge: 2016-05-12 | Disposition: A | Discharge status: Home / Self Care | Payer: Self-pay | Attending: Emergency Medicine | Admitting: Emergency Medicine

## 2016-05-12 DIAGNOSIS — N750 Cyst of Bartholin's gland: Secondary | ICD-10-CM | POA: Insufficient documentation

## 2016-05-12 DIAGNOSIS — Z791 Long term (current) use of non-steroidal anti-inflammatories (NSAID): Secondary | ICD-10-CM | POA: Insufficient documentation

## 2016-05-12 DIAGNOSIS — Z792 Long term (current) use of antibiotics: Secondary | ICD-10-CM | POA: Insufficient documentation

## 2016-05-12 DIAGNOSIS — Z79899 Other long term (current) drug therapy: Secondary | ICD-10-CM | POA: Insufficient documentation

## 2016-05-12 DIAGNOSIS — N939 Abnormal uterine and vaginal bleeding, unspecified: Secondary | ICD-10-CM | POA: Insufficient documentation

## 2016-05-12 DIAGNOSIS — N898 Other specified noninflammatory disorders of vagina: Secondary | ICD-10-CM | POA: Insufficient documentation

## 2016-05-12 LAB — CHLAMYDIA/NGC RT PCR (ARMC ONLY)
Chlamydia Tr: NOT DETECTED
N gonorrhoeae: NOT DETECTED

## 2016-05-12 LAB — WET PREP, GENITAL
CLUE CELLS WET PREP: NONE SEEN
SPERM: NONE SEEN
Trich, Wet Prep: NONE SEEN
Yeast Wet Prep HPF POC: NONE SEEN

## 2016-05-12 MED ORDER — IBUPROFEN 400 MG PO TABS
ORAL_TABLET | ORAL | Status: DC
Start: 2016-05-12 — End: 2016-05-13
  Filled 2016-05-12: qty 1

## 2016-05-12 MED ORDER — IBUPROFEN 400 MG PO TABS
400.0000 mg | ORAL_TABLET | Freq: Once | ORAL | Status: AC
  Administered 2016-05-12: 400 mg via ORAL

## 2016-05-12 NOTE — ED Provider Notes (Signed)
[facility] Emergency Department Provider Note  ____________________________________________  Time seen: Approximately 8:55 PM  I have reviewed the triage vital signs and the nursing notes.   HISTORY  Chief Complaint Vaginal Discharge    HPI Candace Rivera is a 30 y.o. female presenting with "bump" on her left labia, and vaginal bleeding and purulent discharge. The patient reports she was seen here 8/20 and was started on Keflex for UTI. Her urinary symptoms improved but she did develop some vaginal discharge for which she started Monistat intravaginal suppositories. Her vaginal itching improved with this but 2 days ago she noted a bump in her left labia. She took some baths with Epsom salts and today had some bloody and purulent discharge with significant improvement in her pain and the bump resolved. She also noted that she was having vaginal discharge with bleeding, her menstrual cycle is due to start within the next 2-3 days. No fevers, chills, abdominal pain, nausea or vomiting, diarrhea.   History reviewed. No pertinent past medical history.  There are no active problems to display for this patient.   Past Surgical History:  Procedure Laterality Date  . APPENDECTOMY      Current Outpatient Rx  . Order #: [id number] Class: Print  . Order #: [id number] Class: Print  . Order #: [id number] Class: Print  . Order #: [id number] Class: Print  . Order #: [id number] Class: Print  . Order #: [id number] Class: Print  . Order #: [id number] Class: Print  . Order #: [id number] Class: Print    Allergies Review of patient's allergies indicates no known allergies.  No family history on file.  Social History Social History  Substance Use Topics  . Smoking status: Never Smoker  . Smokeless tobacco: Never Used  . Alcohol use No    Review of Systems Constitutional: No fever/chills. Eyes: No visual changes. ENT: No sore throat. No congestion or  rhinorrhea. Cardiovascular: Denies chest pain. Denies palpitations. Respiratory: Denies shortness of breath.  No cough. Gastrointestinal: No abdominal pain.  No nausea, no vomiting.  No diarrhea.  No constipation. Genitourinary: Negative for dysuria.Positive for labial mass. Positive for vaginal bleeding and discharge. Musculoskeletal: Negative for back pain. Skin: Negative for rash. Neurological: Negative for headaches. No focal numbness, tingling or weakness.   10-point ROS otherwise negative.  ____________________________________________   PHYSICAL EXAM:  VITAL SIGNS: ED Triage Vitals  Enc Vitals Group     BP 05/12/16 1714 120/81     Pulse Rate 05/12/16 1714 91     Resp 05/12/16 1714 15     Temp 05/12/16 1714 98.6 F (37 C)     Temp Source 05/12/16 1714 Oral     SpO2 05/12/16 1714 100 %     Weight 05/12/16 1715 104 lb (47.2 kg)     Height 05/12/16 1715 5\' 4"  (1.626 m)     Head Circumference --      Peak Flow --      Pain Score 05/12/16 1715 0     Pain Loc --      Pain Edu? --      Excl. in GC? --     Constitutional: Alert and oriented. Well appearing and in no acute distress. Answers questions appropriately. Eyes: Conjunctivae are normal.  EOMI. No scleral icterus. Head: Atraumatic. Nose: No congestion/rhinnorhea. Mouth/Throat: Mucous membranes are moist.  Neck: No stridor.  Supple.   Cardiovascular: Normal rate, regular rhythm. No murmurs, rubs or gallops.  Respiratory: Normal respiratory effort.  No accessory muscle use or retractions. Lungs CTAB.  No wheezes, rales or ronchi. Gastrointestinal: Soft, nontender and nondistended.  No guarding or rebound.  No peritoneal signs. Genitourinary: Normal-appearing external genitalia. The left labia has some tenderness to palpation on the posterior aspect without any evidence of mass or fluctuance. Speculum exam reveals brown vaginal blood mixed with some thick discharge, normal-appearing cervix. Bimanual exam is negative for  CMT, adnexal tenderness, or pelvic mass that is palpable. Musculoskeletal: No LE edema.  Neurologic:  A&Ox3.  Speech is clear.  Face and smile are symmetric.  EOMI.  Moves all extremities well. Skin:  Skin is warm, dry and intact. No rash noted. Psychiatric: Mood and affect are normal. Speech and behavior are normal.  Normal judgement.  ____________________________________________   LABS (all labs ordered are listed, but only abnormal results are displayed)  Labs Reviewed  WET PREP, GENITAL - Abnormal; Notable for the following:       Result Value   WBC, Wet Prep HPF POC MODERATE (*)    All other components within normal limits  CHLAMYDIA/NGC RT PCR ([facility] ONLY)   ____________________________________________  EKG  Not indicated  ____________________________________________  RADIOLOGY  No results found.  ____________________________________________   PROCEDURES  Procedure(s) performed: None  Procedures  Critical Care performed: No ____________________________________________   INITIAL IMPRESSION / ASSESSMENT AND PLAN / ED COURSE  Pertinent labs & imaging results that were available during my care of the patient were reviewed by me and considered in my medical decision making (see chart for details).  30 y.o. female presenting with bloody vaginal discharge, as well as likely resolving Bartholin's cyst. My examination does not show any evidence of PID or cervicitis. I'm awaiting the wet prep for final results evaluating for sexual transmitted illness or vaginal infection. The patient understands the gonorrhea and chlamydia may not come back tonight and she will have to have her primary care physician follow-up.  Anticipate discharge.  ----------------------------------------- 9:48 PM on 05/12/2016 -----------------------------------------  The patient's wet prep does show some white blood cells but does not show any acute infection. She will be discharged  home in stable condition she understands her follow-up instructions as well as return precautions.  ____________________________________________  FINAL CLINICAL IMPRESSION(S) / ED DIAGNOSES  Final diagnoses:  Bartholin cyst  Vaginal bleeding  Vaginal discharge    Clinical Course      NEW MEDICATIONS STARTED DURING THIS VISIT:  New Prescriptions   No medications on file      Rockne Menghini, MD 05/12/16 2149

## 2016-05-12 NOTE — ED Triage Notes (Signed)
Vaginal discharge, "puss and blood". Patient states was seen and treated for UTI 1 week ago, taking Keflex.  2 days after starting Keflex, patient noticed vaginal itching and bought OTC Monistat, with moderate relief.  3 days ago, patient felt a "lump" in left labia.

## 2016-05-12 NOTE — Discharge Instructions (Signed)
Please continue to take daily Epsom salt baths daily until your labial pain has completely resolved. You may take Tylenol or Motrin for additional pain control.  Please make a follow-up appointment with the gynecologist on call, Dr. Feliberto GottronSchermerhorn. Please make sure you have him follow-up your gonorrhea and chlamydia testing as well.  Return to the emergency department for severe pain, fever, inability to keep down fluids, or any other symptoms concerning to you.

## 2016-09-16 HISTORY — PX: CYST REMOVAL NECK: SHX6281

## 2016-10-03 ENCOUNTER — Encounter: Payer: Self-pay | Admitting: Emergency Medicine

## 2016-10-03 DIAGNOSIS — R05 Cough: Secondary | ICD-10-CM | POA: Insufficient documentation

## 2016-10-03 DIAGNOSIS — Z79899 Other long term (current) drug therapy: Secondary | ICD-10-CM | POA: Insufficient documentation

## 2016-10-03 DIAGNOSIS — Z791 Long term (current) use of non-steroidal anti-inflammatories (NSAID): Secondary | ICD-10-CM | POA: Insufficient documentation

## 2016-10-03 DIAGNOSIS — G44209 Tension-type headache, unspecified, not intractable: Secondary | ICD-10-CM | POA: Insufficient documentation

## 2016-10-03 NOTE — ED Triage Notes (Signed)
Patient ambulatory to triage with steady gait, without difficulty or distress noted, mask in place; pt reports HA to top of head x 3 weeks and nonprod cough x week

## 2016-10-04 ENCOUNTER — Emergency Department
Admission: EM | Admit: 2016-10-04 | Discharge: 2016-10-04 | Disposition: A | Discharge status: Home / Self Care | Payer: Self-pay | Attending: Emergency Medicine | Admitting: Emergency Medicine

## 2016-10-04 ENCOUNTER — Emergency Department: Payer: Self-pay

## 2016-10-04 DIAGNOSIS — R059 Cough, unspecified: Secondary | ICD-10-CM

## 2016-10-04 DIAGNOSIS — G44209 Tension-type headache, unspecified, not intractable: Secondary | ICD-10-CM

## 2016-10-04 DIAGNOSIS — R05 Cough: Secondary | ICD-10-CM

## 2016-10-04 MED ORDER — BENZONATATE 100 MG PO CAPS: 100.0000 mg | ORAL_CAPSULE | Freq: Four times a day (QID) | ORAL | 0 refills | Status: DC | PRN

## 2016-10-04 MED ORDER — BUTALBITAL-APAP-CAFFEINE 50-325-40 MG PO TABS: 1.0000 | ORAL_TABLET | Freq: Four times a day (QID) | ORAL | 0 refills | Status: AC | PRN

## 2016-10-04 MED ORDER — METOCLOPRAMIDE HCL 5 MG/ML IJ SOLN
10.0000 mg | Freq: Once | INTRAMUSCULAR | Status: AC
  Administered 2016-10-04: 10 mg via INTRAVENOUS
  Filled 2016-10-04: qty 2

## 2016-10-04 MED ORDER — DIPHENHYDRAMINE HCL 50 MG/ML IJ SOLN
25.0000 mg | Freq: Once | INTRAMUSCULAR | Status: AC
  Administered 2016-10-04: 25 mg via INTRAVENOUS
  Filled 2016-10-04: qty 1

## 2016-10-04 MED ORDER — KETOROLAC TROMETHAMINE 30 MG/ML IJ SOLN
30.0000 mg | Freq: Once | INTRAMUSCULAR | Status: AC
  Administered 2016-10-04: 30 mg via INTRAVENOUS
  Filled 2016-10-04: qty 1

## 2016-10-04 MED ORDER — SODIUM CHLORIDE 0.9 % IV BOLUS (SEPSIS)
1000.0000 mL | Freq: Once | INTRAVENOUS | Status: AC
  Administered 2016-10-04: 1000 mL via INTRAVENOUS

## 2016-10-04 NOTE — ED Provider Notes (Signed)
[facility] Emergency Department Provider Note   ____________________________________________   First MD Initiated Contact with Patient 10/04/16 0109     (approximate)  I have reviewed the triage vital signs and the nursing notes.   HISTORY  Chief Complaint Headache and Cough    HPI Candace Rivera is a 31 y.o. female who comes into the hospital today with some headache. She reports that she has some pressure to the top of her head and her ears are achy. She's also had a dry cough. The patient has had a headache on and off for about 3 weeks. She reports that she's been taking Aleve Tylenol and ibuprofen but nothing seems to be helping. The patient rates her pain a 9 out of 10 in intensity. She feels that someone is squeezing her head. She denies any weakness, numbness or tingling. She's not had any fevers. She reports that she's had the cough and ear aches for one week. She's not had any sputum production but she does endorse a bad taste in the back of her throat whenever she coughs. She reports that she's been sucking on halls without any improvement. She's had no sick contacts and no blurred vision. The patient decided to come into the hospital today for evaluation.   History reviewed. No pertinent past medical history.  There are no active problems to display for this patient.   Past Surgical History:  Procedure Laterality Date  . APPENDECTOMY      Prior to Admission medications   Medication Sig Start Date End Date Taking? Authorizing Provider  azithromycin (ZITHROMAX Z-PAK) 250 MG tablet Take 2 tablets (500 mg) on  Day 1,  followed by 1 tablet (250 mg) once daily on Days 2 through 5. 08/21/15   Evangeline Dakin, PA-C  benzonatate (TESSALON PERLES) 100 MG capsule Take 1 capsule (100 mg total) by mouth every 6 (six) hours as needed for cough. 10/04/16   Rebecka Apley, MD  butalbital-acetaminophen-caffeine (FIORICET, ESGIC) [phone] MG tablet Take  1-2 tablets by mouth every 6 (six) hours as needed for headache. 10/04/16 10/04/17  Rebecka Apley, MD  cephALEXin (KEFLEX) 500 MG capsule Take 1 capsule (500 mg total) by mouth 2 (two) times daily. 05/05/16   Jene Every, MD  cyclobenzaprine (FLEXERIL) 10 MG tablet Take 1 tablet (10 mg total) by mouth every 8 (eight) hours as needed for muscle spasms. 03/01/16   Ignacia Bayley, PA-C  guaiFENesin-codeine 100-10 MG/5ML syrup Take 10 mLs by mouth every 4 (four) hours as needed for cough. 08/21/15   Charmayne Sheer Beers, PA-C  ibuprofen (ADVIL,MOTRIN) 800 MG tablet Take 1 tablet (800 mg total) by mouth every 8 (eight) hours as needed. 08/21/15   Charmayne Sheer Beers, PA-C  LORazepam (ATIVAN) 0.5 MG tablet Take 1 tablet (0.5 mg total) by mouth every 8 (eight) hours as needed for anxiety. 01/25/16 01/24/17  Emily Filbert, MD  pantoprazole (PROTONIX) 20 MG tablet Take 1 tablet (20 mg total) by mouth daily. 10/03/15 10/02/16  Minna Antis, MD  predniSONE (STERAPRED UNI-PAK 21 TAB) 10 MG (21) TBPK tablet 6 tablets on day 1, 5 tablets on day 2, 4 tablets on day 3, etc... 03/01/16   Ignacia Bayley, PA-C    Allergies Patient has no known allergies.  No family history on file.  Social History Social History  Substance Use Topics  . Smoking status: Never Smoker  . Smokeless tobacco: Never Used  . Alcohol use No    Review of  Systems Constitutional: No fever/chills Eyes: No visual changes. ENT: No sore throat. Cardiovascular: Denies chest pain. Respiratory: Cough Gastrointestinal: No abdominal pain.  No nausea, no vomiting.  No diarrhea.  No constipation. Genitourinary: Negative for dysuria. Musculoskeletal: Negative for back pain. Skin: Negative for rash. Neurological: Headache  10-point ROS otherwise negative.  ____________________________________________   PHYSICAL EXAM:  VITAL SIGNS: ED Triage Vitals  Enc Vitals Group     BP 10/03/16 2254 (!) 142/93     Pulse Rate 10/03/16 2254 95     Resp  10/03/16 2254 18     Temp 10/03/16 2254 98.1 F (36.7 C)     Temp Source 10/03/16 2254 Oral     SpO2 10/03/16 2254 97 %     Weight 10/03/16 2253 110 lb (49.9 kg)     Height 10/03/16 2253 5\' 5"  (1.651 m)     Head Circumference --      Peak Flow --      Pain Score 10/03/16 2253 9     Pain Loc --      Pain Edu? --      Excl. in GC? --     Constitutional: Alert and oriented. Well appearing and in Mild distress. Eyes: Conjunctivae are normal. PERRL. EOMI. Head: Atraumatic. Nose: No congestion/rhinnorhea. Mouth/Throat: Mucous membranes are moist.  Oropharynx non-erythematous. Cardiovascular: Normal rate, regular rhythm. Grossly normal heart sounds.  Good peripheral circulation. Respiratory: Normal respiratory effort.  No retractions. Lungs CTAB. Gastrointestinal: Soft and nontender. No distention. Positive bowel sounds Musculoskeletal: No lower extremity tenderness nor edema.   Neurologic:  Normal speech and language. Cranial nerves II through XII are grossly intact with no focal motor or neuro deficits Skin:  Skin is warm, dry and intact.  Psychiatric: Mood and affect are normal.   ____________________________________________   LABS (all labs ordered are listed, but only abnormal results are displayed)  Labs Reviewed - No data to display ____________________________________________  EKG  none ____________________________________________  RADIOLOGY  CT head CXR ____________________________________________   PROCEDURES  Procedure(s) performed: None  Procedures  Critical Care performed: No  ____________________________________________   INITIAL IMPRESSION / ASSESSMENT AND PLAN / ED COURSE  Pertinent labs & imaging results that were available during my care of the patient were reviewed by me and considered in my medical decision making (see chart for details).  This is a 31 year old female who comes into the hospital today with the headache and a cough. The  patient has had this headache for some time and she reports this is not normal for her to have these headaches. I will give the patient some Reglan, Benadryl, Toradol and normal saline. I will also send the patient for CT of her head and a chest x-ray. The patient be reassessed once I received the results of her imaging and she has received her medications.  Clinical Course as of Oct 05 315  Fri Oct 04, 2016  0147 No active cardiopulmonary disease. DG Chest 2 View [AW]  0220 Unremarkable noncontrast head CT. CT Head Wo Contrast [AW]    Clinical Course User Index [AW] Rebecka Apley, MD   After the patient received a medication she reports that she feels better. She reports that she wants to go home she doesn't want to stay anymore. I did tell her the results and she said that that was fine she was ready to go home. I have informed her that she should follow-up with the acute care clinic and should she have any worsening symptoms  she should return to emergency department. The patient will be discharged home.  ____________________________________________   FINAL CLINICAL IMPRESSION(S) / ED DIAGNOSES  Final diagnoses:  Acute non intractable tension-type headache  Cough      NEW MEDICATIONS STARTED DURING THIS VISIT:  New Prescriptions   BENZONATATE (TESSALON PERLES) 100 MG CAPSULE    Take 1 capsule (100 mg total) by mouth every 6 (six) hours as needed for cough.   BUTALBITAL-ACETAMINOPHEN-CAFFEINE (FIORICET, ESGIC) 50-325-40 MG TABLET    Take 1-2 tablets by mouth every 6 (six) hours as needed for headache.     Note:  This document was prepared using Dragon voice recognition software and may include unintentional dictation errors.    Rebecka ApleyAllison P Annmargaret Decaprio, MD 10/04/16 [phone]

## 2016-10-04 NOTE — Discharge Instructions (Signed)
Please follow up with the acute care clinic. Please return if he have any worsening headaches or any worsening symptoms over the next 2 days.

## 2016-10-04 NOTE — ED Notes (Signed)
ED Provider at bedside. 

## 2016-10-04 NOTE — ED Notes (Signed)
Patient transported to CT 

## 2016-10-04 NOTE — ED Notes (Signed)
Patient transported to X-ray 

## 2016-12-07 ENCOUNTER — Encounter: Payer: Self-pay | Admitting: Emergency Medicine

## 2016-12-07 DIAGNOSIS — R197 Diarrhea, unspecified: Secondary | ICD-10-CM | POA: Insufficient documentation

## 2016-12-07 DIAGNOSIS — R112 Nausea with vomiting, unspecified: Secondary | ICD-10-CM | POA: Insufficient documentation

## 2016-12-07 DIAGNOSIS — Z79899 Other long term (current) drug therapy: Secondary | ICD-10-CM | POA: Insufficient documentation

## 2016-12-07 DIAGNOSIS — R1013 Epigastric pain: Secondary | ICD-10-CM | POA: Insufficient documentation

## 2016-12-07 NOTE — ED Triage Notes (Addendum)
Patient states that she has nausea, vomiting and abdominal that started this morning. Patient states that she has vomited times 5 today.

## 2016-12-08 ENCOUNTER — Emergency Department
Admission: EM | Admit: 2016-12-08 | Discharge: 2016-12-08 | Disposition: A | Discharge status: Home / Self Care | Payer: 59 | Attending: Emergency Medicine | Admitting: Emergency Medicine

## 2016-12-08 DIAGNOSIS — R112 Nausea with vomiting, unspecified: Secondary | ICD-10-CM

## 2016-12-08 DIAGNOSIS — R1013 Epigastric pain: Secondary | ICD-10-CM

## 2016-12-08 DIAGNOSIS — R197 Diarrhea, unspecified: Secondary | ICD-10-CM

## 2016-12-08 LAB — LIPASE, BLOOD: LIPASE: 17 U/L (ref 11–51)

## 2016-12-08 LAB — CBC
HCT: 38.9 % (ref 35.0–47.0)
Hemoglobin: 13.5 g/dL (ref 12.0–16.0)
MCH: 31 pg (ref 26.0–34.0)
MCHC: 34.7 g/dL (ref 32.0–36.0)
MCV: 89.4 fL (ref 80.0–100.0)
PLATELETS: 301 10*3/uL (ref 150–440)
RBC: 4.36 MIL/uL (ref 3.80–5.20)
RDW: 13.4 % (ref 11.5–14.5)
WBC: 9 10*3/uL (ref 3.6–11.0)

## 2016-12-08 LAB — COMPREHENSIVE METABOLIC PANEL
ALBUMIN: 4 g/dL (ref 3.5–5.0)
ALT: 13 U/L — ABNORMAL LOW (ref 14–54)
AST: 21 U/L (ref 15–41)
Alkaline Phosphatase: 53 U/L (ref 38–126)
Anion gap: 8 (ref 5–15)
BUN: 13 mg/dL (ref 6–20)
CHLORIDE: 108 mmol/L (ref 101–111)
CO2: 24 mmol/L (ref 22–32)
Calcium: 8.9 mg/dL (ref 8.9–10.3)
Creatinine, Ser: 0.72 mg/dL (ref 0.44–1.00)
GFR calc Af Amer: >60 mL/min (ref >60)
GFR calc non Af Amer: >60 mL/min (ref >60)
GLUCOSE: 78 mg/dL (ref 65–99)
POTASSIUM: 3.2 mmol/L — AB (ref 3.5–5.1)
SODIUM: 140 mmol/L (ref 135–145)
Total Bilirubin: 1 mg/dL (ref 0.3–1.2)
Total Protein: 7.8 g/dL (ref 6.5–8.1)

## 2016-12-08 LAB — URINALYSIS, COMPLETE (UACMP) WITH MICROSCOPIC
Bacteria, UA: NONE SEEN
Bilirubin Urine: NEGATIVE
GLUCOSE, UA: NEGATIVE mg/dL
Hgb urine dipstick: NEGATIVE
Ketones, ur: 20 mg/dL — AB
Leukocytes, UA: NEGATIVE
Nitrite: NEGATIVE
PH: 5 (ref 5.0–8.0)
Protein, ur: NEGATIVE mg/dL
Specific Gravity, Urine: 1.028 (ref 1.005–1.030)

## 2016-12-08 LAB — TROPONIN I: Troponin I: <0.03 ng/mL (ref <0.03)

## 2016-12-08 MED ORDER — ONDANSETRON HCL 4 MG/2ML IJ SOLN
4.0000 mg | Freq: Once | INTRAMUSCULAR | Status: AC
  Administered 2016-12-08: 4 mg via INTRAVENOUS
  Filled 2016-12-08: qty 2

## 2016-12-08 MED ORDER — METOCLOPRAMIDE HCL 10 MG PO TABS: 10.0000 mg | ORAL_TABLET | Freq: Three times a day (TID) | ORAL | 0 refills | Status: DC | PRN

## 2016-12-08 MED ORDER — MORPHINE SULFATE (PF) 4 MG/ML IV SOLN
4.0000 mg | Freq: Once | INTRAVENOUS | Status: AC
  Administered 2016-12-08: 4 mg via INTRAVENOUS
  Filled 2016-12-08: qty 1

## 2016-12-08 MED ORDER — METOCLOPRAMIDE HCL 5 MG/ML IJ SOLN
INTRAMUSCULAR | Status: AC
  Administered 2016-12-08: 10 mg via INTRAVENOUS
  Filled 2016-12-08: qty 2

## 2016-12-08 MED ORDER — METOCLOPRAMIDE HCL 5 MG/ML IJ SOLN
10.0000 mg | Freq: Once | INTRAMUSCULAR | Status: AC
  Administered 2016-12-08: 10 mg via INTRAVENOUS

## 2016-12-08 MED ORDER — SODIUM CHLORIDE 0.9 % IV BOLUS (SEPSIS)
1000.0000 mL | Freq: Once | INTRAVENOUS | Status: AC
  Administered 2016-12-08: 1000 mL via INTRAVENOUS

## 2016-12-08 NOTE — ED Notes (Signed)
PO challenge: pt reports "queasy", Dr Zenda AlpersWebster notified, orders rx'd

## 2016-12-08 NOTE — ED Provider Notes (Signed)
[facility] Emergency Department Provider Note   ____________________________________________   First MD Initiated Contact with Patient 12/08/16 [phone]     (approximate)  I have reviewed the triage vital signs and the nursing notes.   HISTORY  Chief Complaint Emesis; Headache; Chills; and Abdominal Pain    HPI Candace Rivera is a 31 y.o. female who comes into the hospital today with vomiting all day and some diarrhea. The patient has some headache and chills and came keep anything down. She reports that she tried to eat chicken nuggets cheese its and a few other things but nothing would stay down. She reports the water comes back up. She reports her emesis looks like what she ate and also just water. She's vomited about 5-6 times. She is only had one episode of brown diarrhea. The patient denies any sick contacts. She states that she has pain in her upper mid abdomen and rates her pain 8-9 out of 10 in intensity. The patient has never had these symptoms before. She denies any fever, chest pain, shortness of breath or pain with urination. The patient is here today for evaluation of the symptoms.   History reviewed. No pertinent past medical history.  There are no active problems to display for this patient.   Past Surgical History:  Procedure Laterality Date  . ABSCESS DRAINAGE    . APPENDECTOMY      Prior to Admission medications   Medication Sig Start Date End Date Taking? Authorizing Provider  azithromycin (ZITHROMAX Z-PAK) 250 MG tablet Take 2 tablets (500 mg) on  Day 1,  followed by 1 tablet (250 mg) once daily on Days 2 through 5. 08/21/15   Evangeline Dakinharles M Beers, PA-C  benzonatate (TESSALON PERLES) 100 MG capsule Take 1 capsule (100 mg total) by mouth every 6 (six) hours as needed for cough. 10/04/16   Rebecka ApleyAllison P Adie Vilar, MD  butalbital-acetaminophen-caffeine (FIORICET, ESGIC) [id number] MG tablet Take 1-2 tablets by mouth every 6 (six) hours as needed for  headache. 10/04/16 10/04/17  Rebecka ApleyAllison P Jimi Giza, MD  cephALEXin (KEFLEX) 500 MG capsule Take 1 capsule (500 mg total) by mouth 2 (two) times daily. 05/05/16   Jene Everyobert Kinner, MD  cyclobenzaprine (FLEXERIL) 10 MG tablet Take 1 tablet (10 mg total) by mouth every 8 (eight) hours as needed for muscle spasms. 03/01/16   Ignacia Bayleyobert Tumey, PA-C  guaiFENesin-codeine 100-10 MG/5ML syrup Take 10 mLs by mouth every 4 (four) hours as needed for cough. 08/21/15   Charmayne Sheerharles M Beers, PA-C  ibuprofen (ADVIL,MOTRIN) 800 MG tablet Take 1 tablet (800 mg total) by mouth every 8 (eight) hours as needed. 08/21/15   Charmayne Sheerharles M Beers, PA-C  LORazepam (ATIVAN) 0.5 MG tablet Take 1 tablet (0.5 mg total) by mouth every 8 (eight) hours as needed for anxiety. 01/25/16 01/24/17  Emily FilbertJonathan E Williams, MD  metoCLOPramide (REGLAN) 10 MG tablet Take 1 tablet (10 mg total) by mouth every 8 (eight) hours as needed. 12/08/16   Rebecka ApleyAllison P Donavin Audino, MD  pantoprazole (PROTONIX) 20 MG tablet Take 1 tablet (20 mg total) by mouth daily. 10/03/15 10/02/16  Minna AntisKevin Paduchowski, MD  predniSONE (STERAPRED UNI-PAK 21 TAB) 10 MG (21) TBPK tablet 6 tablets on day 1, 5 tablets on day 2, 4 tablets on day 3, etc... 03/01/16   Ignacia Bayleyobert Tumey, PA-C    Allergies Patient has no known allergies.  No family history on file.  Social History Social History  Substance Use Topics  . Smoking status: Never Smoker  .  Smokeless tobacco: Never Used  . Alcohol use Yes     Comment: occ    Review of Systems Constitutional: No fever/chills Eyes: No visual changes. ENT: No sore throat. Cardiovascular: Denies chest pain. Respiratory: Denies shortness of breath. Gastrointestinal:  abdominal pain, nausea, vomiting, diarrhea.  No constipation. Genitourinary: Negative for dysuria. Musculoskeletal: Negative for back pain. Skin: Negative for rash. Neurological: Negative for headaches, focal weakness or numbness.  10-point ROS otherwise  negative.  ____________________________________________   PHYSICAL EXAM:  VITAL SIGNS: ED Triage Vitals  Enc Vitals Group     BP 12/07/16 2356 114/77     Pulse Rate 12/07/16 2356 (!) 104     Resp 12/07/16 2356 18     Temp 12/07/16 2356 98.3 F (36.8 C)     Temp Source 12/07/16 2356 Oral     SpO2 12/07/16 2356 100 %     Weight 12/07/16 2357 110 lb (49.9 kg)     Height 12/07/16 2357 5\' 5"  (1.651 m)     Head Circumference --      Peak Flow --      Pain Score 12/07/16 2357 10     Pain Loc --      Pain Edu? --      Excl. in GC? --     Constitutional: Alert and oriented. Well appearing and in moderate distress. Eyes: Conjunctivae are normal. PERRL. EOMI. Head: Atraumatic. Nose: No congestion/rhinnorhea. Mouth/Throat: Mucous membranes are moist.  Oropharynx non-erythematous. Cardiovascular: Normal rate, regular rhythm. Grossly normal heart sounds.  Good peripheral circulation. Respiratory: Normal respiratory effort.  No retractions. Lungs CTAB. Gastrointestinal: Soft With epigastric tenderness to palpation. No distention.Positive bowel sounds  Musculoskeletal: No lower extremity tenderness nor edema.   Neurologic:  Normal speech and language.  Skin:  Skin is warm, dry and intact. Psychiatric: Mood and affect are normal.   ____________________________________________   LABS (all labs ordered are listed, but only abnormal results are displayed)  Labs Reviewed  COMPREHENSIVE METABOLIC PANEL - Abnormal; Notable for the following:       Result Value   Potassium 3.2 (*)    ALT 13 (*)    All other components within normal limits  URINALYSIS, COMPLETE (UACMP) WITH MICROSCOPIC - Abnormal; Notable for the following:    Color, Urine YELLOW (*)    APPearance HAZY (*)    Ketones, ur 20 (*)    Squamous Epithelial / LPF 6-30 (*)    All other components within normal limits  CBC  LIPASE, BLOOD  TROPONIN I   ____________________________________________  EKG  ED ECG REPORT I,  Rebecka Apley, the attending physician, personally viewed and interpreted this ECG.   Date: 12/08/2016  EKG Time: 0057  Rate: 93  Rhythm: normal sinus rhythm  Axis: normal  Intervals:none  ST&T Change: none  ____________________________________________  RADIOLOGY  none ____________________________________________   PROCEDURES  Procedure(s) performed: None  Procedures  Critical Care performed: No  ____________________________________________   INITIAL IMPRESSION / ASSESSMENT AND PLAN / ED COURSE  Pertinent labs & imaging results that were available during my care of the patient were reviewed by me and considered in my medical decision making (see chart for details).  This is a 31 year old female who comes into the hospital today with some nausea vomiting and epigastric pain. The patient is also had some diarrhea. I will give the patient some morphine and Zofran for her pain as well as liter of normal saline. We will check some blood work to include a  lipase for pancreatitis and troponin for acute coronary syndrome. I will reassess the patient when she's receive her medication and I received the results of her blood work. She does have pancreatitis will consider an ultrasound looking for biliary disease.     The patient's pain did improve. Her lipase is unremarkable. Since she's had these symptoms all day and do not feel she needs a second troponin. She was still nauseous so I gave her some Reglan. The patient's nausea improved. She'll be discharged home as I feel that she has some gastroenteritis. She should return with any worsening symptoms.  ____________________________________________   FINAL CLINICAL IMPRESSION(S) / ED DIAGNOSES  Final diagnoses:  Nausea vomiting and diarrhea  Epigastric pain      NEW MEDICATIONS STARTED DURING THIS VISIT:  New Prescriptions   METOCLOPRAMIDE (REGLAN) 10 MG TABLET    Take 1 tablet (10 mg total) by mouth every 8 (eight)  hours as needed.     Note:  This document was prepared using Dragon voice recognition software and may include unintentional dictation errors.    Rebecka Apley, MD 12/08/16 [phone]

## 2017-01-29 ENCOUNTER — Encounter: Payer: Self-pay | Admitting: Gynecology

## 2017-03-03 ENCOUNTER — Encounter: Payer: Self-pay | Admitting: Emergency Medicine

## 2017-03-03 DIAGNOSIS — Z5321 Procedure and treatment not carried out due to patient leaving prior to being seen by health care provider: Secondary | ICD-10-CM | POA: Diagnosis not present

## 2017-03-03 DIAGNOSIS — J3489 Other specified disorders of nose and nasal sinuses: Secondary | ICD-10-CM | POA: Diagnosis present

## 2017-03-03 NOTE — ED Triage Notes (Signed)
Patient ambulatory to triage with steady gait, without difficulty or distress noted; pt reports frontal HA/sinus pressure; st tx recently for sinus infection

## 2017-03-04 ENCOUNTER — Emergency Department
Admission: EM | Admit: 2017-03-04 | Discharge: 2017-03-04 | Disposition: A | Discharge status: Home / Self Care | Payer: 59 | Attending: Emergency Medicine | Admitting: Emergency Medicine

## 2017-06-11 IMAGING — CR DG CHEST 2V
2 series · 2 of 2 positions shown · non-contrast
Comparison: None.

CLINICAL DATA: Headache and nonproductive cough

EXAM:
CHEST  2 VIEW

[chest pa]
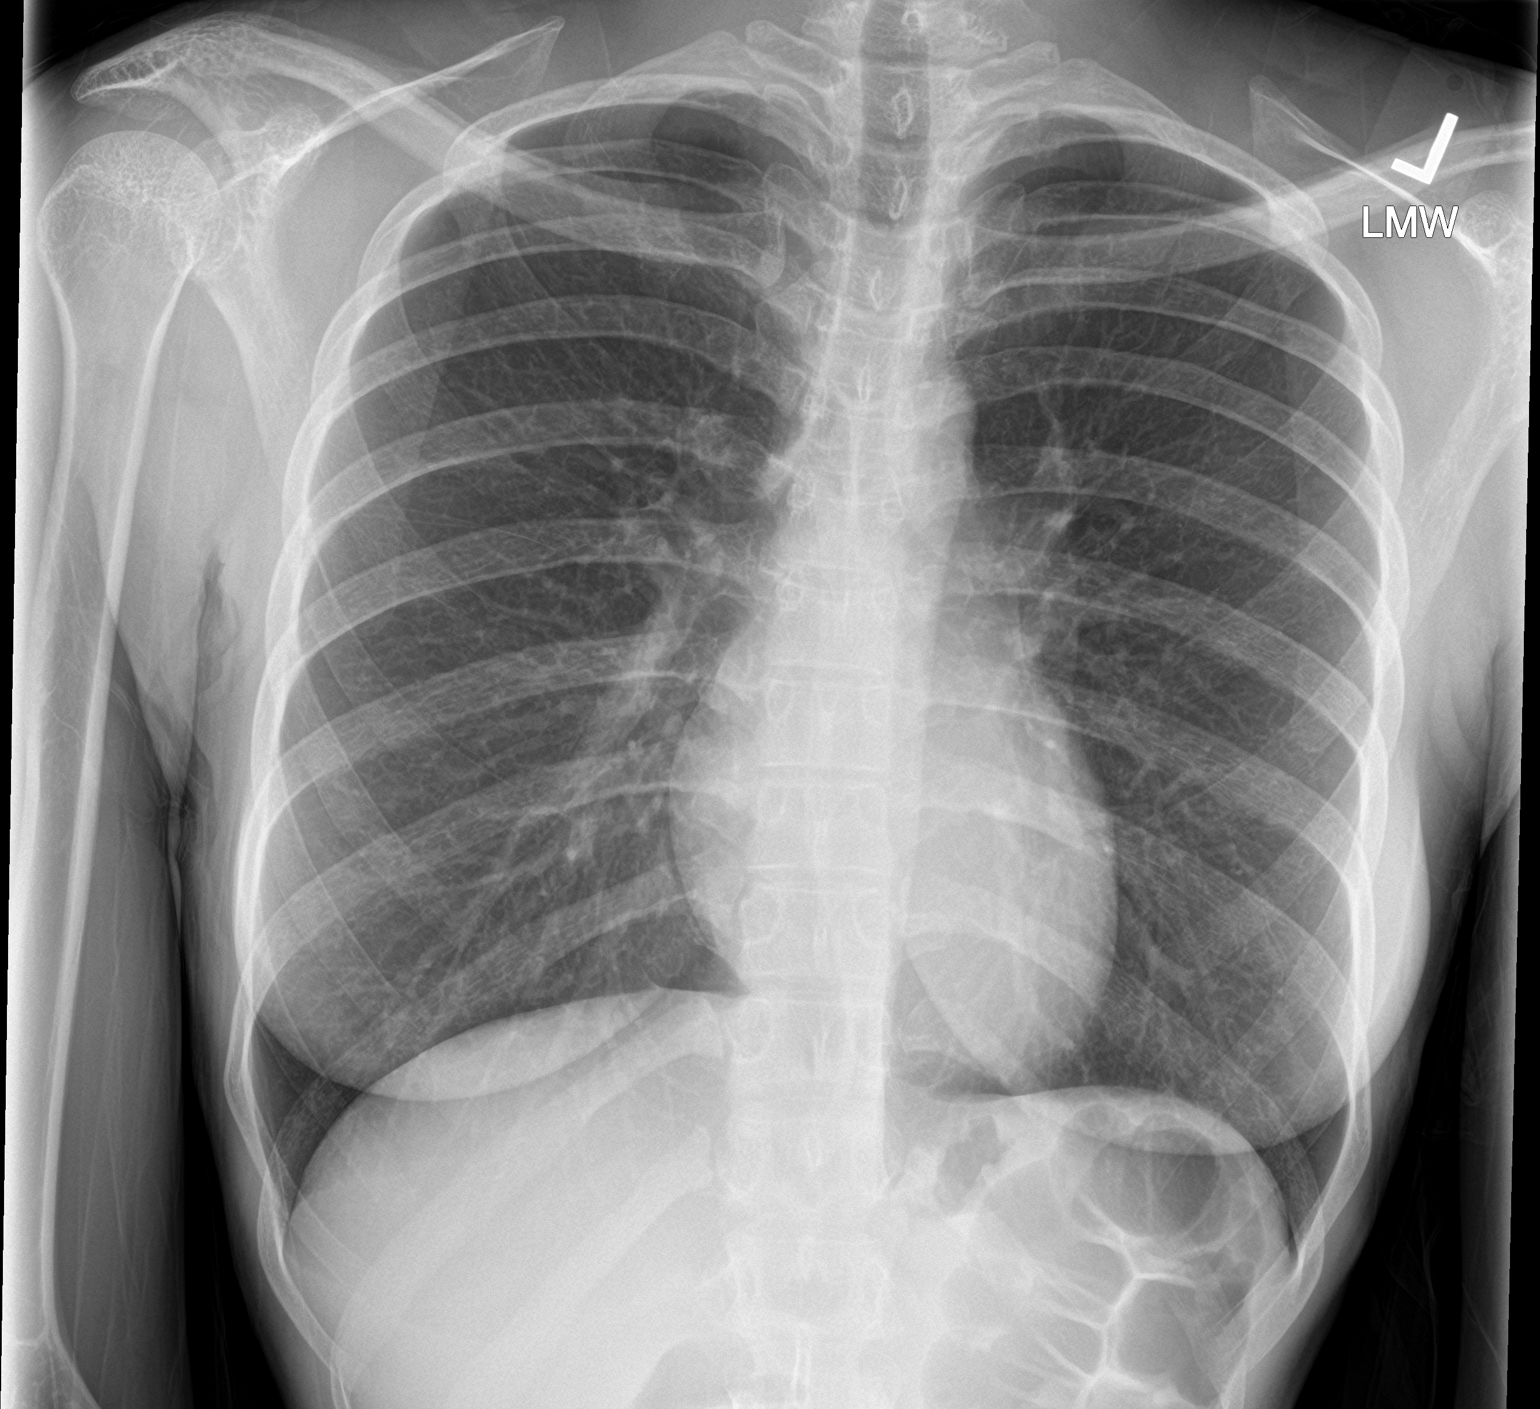

[chest lat]
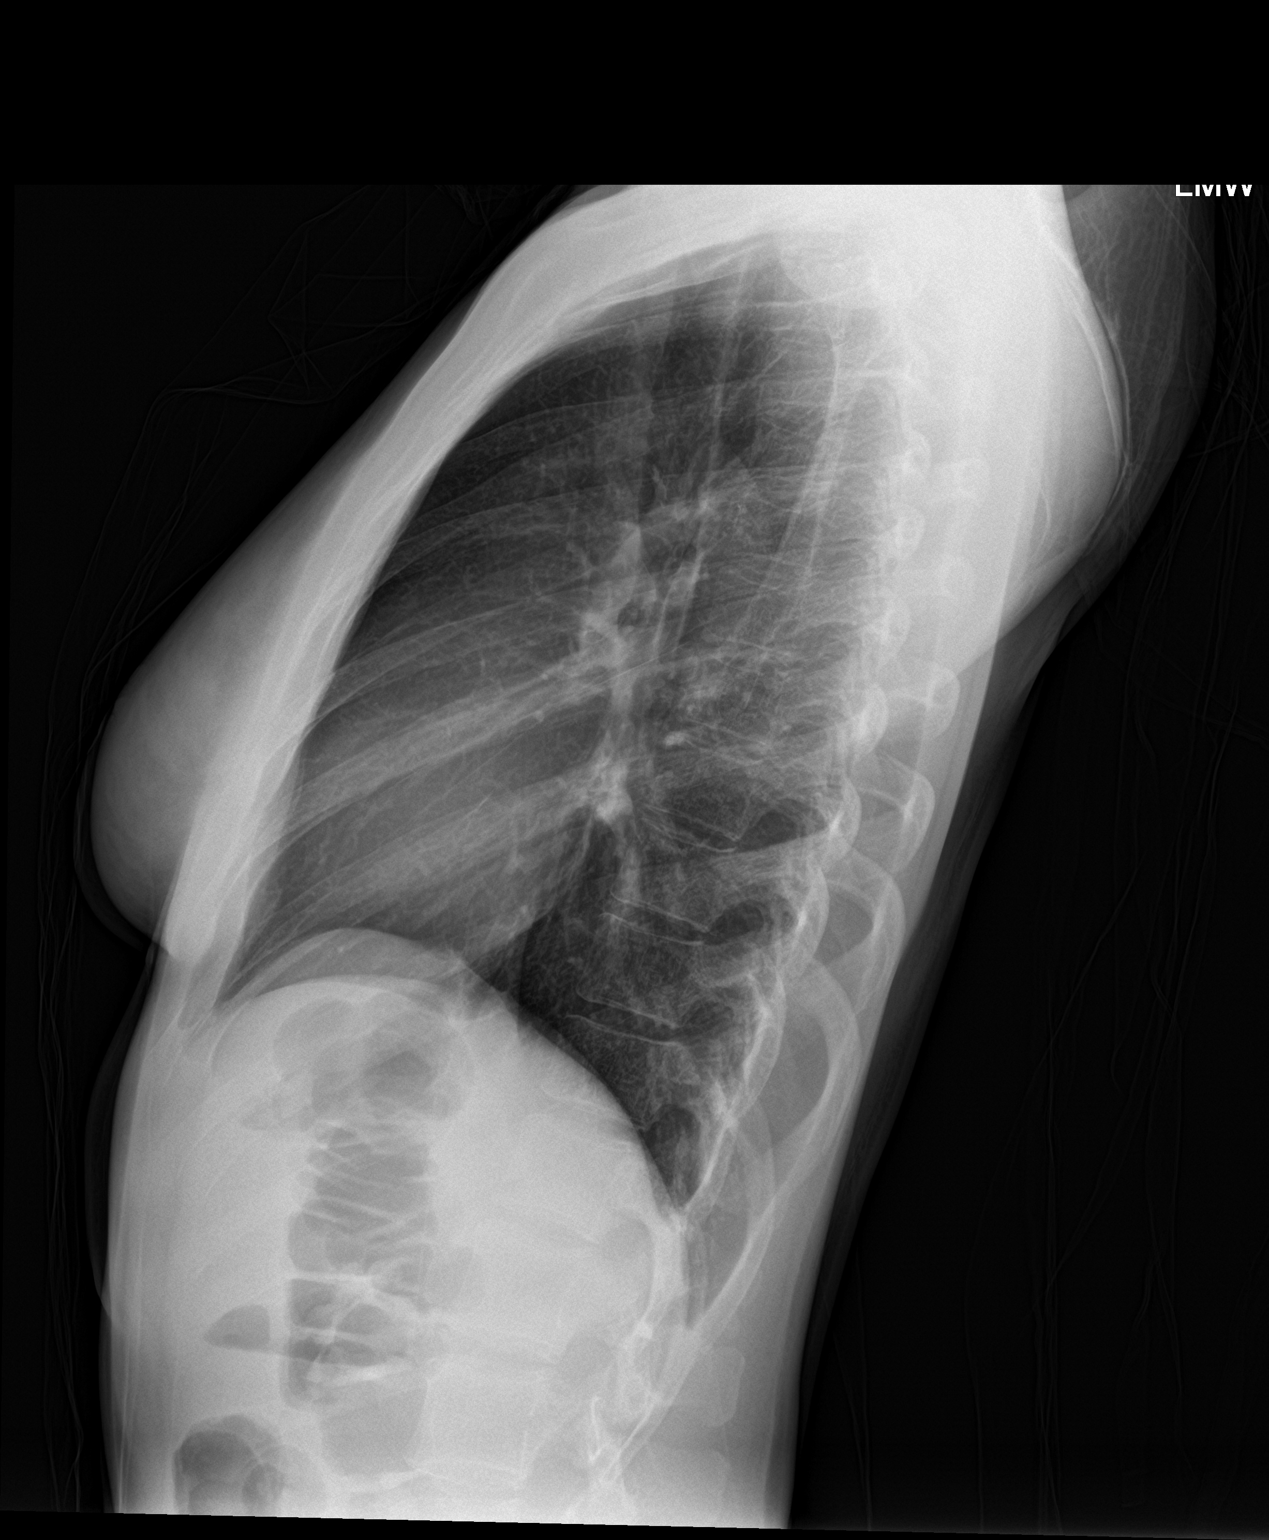

[2 of 2 positions shown; findings below may reference images not displayed]

FINDINGS: The heart size and mediastinal contours are within normal limits.
Both lungs are clear. The visualized skeletal structures are
unremarkable.
IMPRESSION: No active cardiopulmonary disease.

## 2017-07-08 ENCOUNTER — Encounter: Payer: Self-pay | Admitting: *Deleted

## 2017-07-08 ENCOUNTER — Emergency Department
Admission: EM | Admit: 2017-07-08 | Discharge: 2017-07-08 | Disposition: A | Discharge status: Home / Self Care | Payer: 59 | Attending: Emergency Medicine | Admitting: Emergency Medicine

## 2017-07-08 DIAGNOSIS — K047 Periapical abscess without sinus: Secondary | ICD-10-CM

## 2017-07-08 DIAGNOSIS — Z79899 Other long term (current) drug therapy: Secondary | ICD-10-CM | POA: Insufficient documentation

## 2017-07-08 MED ORDER — CLINDAMYCIN HCL 300 MG PO CAPS: 300.0000 mg | ORAL_CAPSULE | Freq: Four times a day (QID) | ORAL | 0 refills | Status: AC

## 2017-07-08 MED ORDER — LIDOCAINE VISCOUS 2 % MT SOLN: 10.0000 mL | OROMUCOSAL | 0 refills | Status: DC | PRN

## 2017-07-08 MED ORDER — CLINDAMYCIN PHOSPHATE 600 MG/4ML IJ SOLN
600.0000 mg | Freq: Once | INTRAMUSCULAR | Status: AC
  Administered 2017-07-08: 600 mg via INTRAMUSCULAR
  Filled 2017-07-08: qty 4

## 2017-07-08 NOTE — ED Provider Notes (Signed)
[facility] Emergency Department Provider Note  ____________________________________________  Time seen: Approximately 1:06 PM  I have reviewed the triage vital signs and the nursing notes.   HISTORY  Chief Complaint Dental Pain    HPI Candace Rivera is a 31 y.o. female that presents to the emergency department for evaluation of low left tooth pain for 2 days. It sarted draining around tooth this morning.Patient states that when she was 16 she had a root canal and the dentist didn't get out all of the cavity. She states that she continuously gets abscesses in this tooth. She last saw the dentist 4 months ago when she had a routine cleaning. She has known cavities. No fever, shortness of breath, nausea, vomiting, abdominal pain.   History reviewed. No pertinent past medical history.  There are no active problems to display for this patient.   Past Surgical History:  Procedure Laterality Date  . ABSCESS DRAINAGE    . APPENDECTOMY      Prior to Admission medications   Medication Sig Start Date End Date Taking? Authorizing Provider  azithromycin (ZITHROMAX Z-PAK) 250 MG tablet Take 2 tablets (500 mg) on  Day 1,  followed by 1 tablet (250 mg) once daily on Days 2 through 5. 08/21/15   Beers, Charmayne Sheer, PA-C  benzonatate (TESSALON PERLES) 100 MG capsule Take 1 capsule (100 mg total) by mouth every 6 (six) hours as needed for cough. 10/04/16   Rebecka Apley, MD  butalbital-acetaminophen-caffeine (FIORICET, ESGIC) [phone] MG tablet Take 1-2 tablets by mouth every 6 (six) hours as needed for headache. 10/04/16 10/04/17  Rebecka Apley, MD  cephALEXin (KEFLEX) 500 MG capsule Take 1 capsule (500 mg total) by mouth 2 (two) times daily. 05/05/16   Jene Every, MD  clindamycin (CLEOCIN) 300 MG capsule Take 1 capsule (300 mg total) by mouth 4 (four) times daily. 07/08/17 07/18/17  Enid Derry, PA-C  cyclobenzaprine (FLEXERIL) 10 MG tablet Take 1 tablet (10  mg total) by mouth every 8 (eight) hours as needed for muscle spasms. 03/01/16   Ignacia Bayley, PA-C  guaiFENesin-codeine 100-10 MG/5ML syrup Take 10 mLs by mouth every 4 (four) hours as needed for cough. 08/21/15   Beers, Charmayne Sheer, PA-C  ibuprofen (ADVIL,MOTRIN) 800 MG tablet Take 1 tablet (800 mg total) by mouth every 8 (eight) hours as needed. 08/21/15   Beers, Charmayne Sheer, PA-C  lidocaine (XYLOCAINE) 2 % solution Use as directed 10 mLs in the mouth or throat as needed for mouth pain. 07/08/17   Enid Derry, PA-C  metoCLOPramide (REGLAN) 10 MG tablet Take 1 tablet (10 mg total) by mouth every 8 (eight) hours as needed. 12/08/16   Rebecka Apley, MD  pantoprazole (PROTONIX) 20 MG tablet Take 1 tablet (20 mg total) by mouth daily. 10/03/15 10/02/16  Minna Antis, MD  predniSONE (STERAPRED UNI-PAK 21 TAB) 10 MG (21) TBPK tablet 6 tablets on day 1, 5 tablets on day 2, 4 tablets on day 3, etc... 03/01/16   Ignacia Bayley, PA-C    Allergies Patient has no known allergies.  History reviewed. No pertinent family history.  Social History Social History  Substance Use Topics  . Smoking status: Never Smoker  . Smokeless tobacco: Never Used  . Alcohol use Yes     Comment: occ     Review of Systems  Constitutional: No fever Cardiovascular: No chest pain. Respiratory: No SOB. Gastrointestinal: No abdominal pain.  No nausea, no vomiting.  Musculoskeletal: Negative for musculoskeletal pain. Skin:  Negative for rash, abrasions, lacerations, ecchymosis. Neurological: Negative for headaches, numbness or tingling   ____________________________________________   PHYSICAL EXAM:  VITAL SIGNS: ED Triage Vitals  Enc Vitals Group     BP 07/08/17 1159 112/80     Pulse Rate 07/08/17 1159 78     Resp 07/08/17 1159 12     Temp 07/08/17 1159 98.7 F (37.1 C)     Temp Source 07/08/17 1159 Oral     SpO2 07/08/17 1159 100 %     Weight 07/08/17 1152 90 lb (40.8 kg)     Height 07/08/17 1152 5\' 4"   (1.626 m)     Head Circumference --      Peak Flow --      Pain Score 07/08/17 1152 7     Pain Loc --      Pain Edu? --      Excl. in GC? --      Constitutional: Alert and oriented. Well appearing and in no acute distress. Eyes: Conjunctivae are normal. PERRL. EOMI. Head: Atraumatic. ENT:      Ears:      Nose: No congestion/rhinnorhea.      Mouth/Throat: Mucous membranes are moist. Large silver cap to low left molar. Yellow drainage lateral to tooth. No TMJ pain. Mild swelling to left cheek. No swelling under chin. Neck: No stridor.  Cardiovascular: Normal rate, regular rhythm.  Good peripheral circulation. Respiratory: Normal respiratory effort without tachypnea or retractions. Lungs CTAB. Good air entry to the bases with no decreased or absent breath sounds. Musculoskeletal: Full range of motion to all extremities. No gross deformities appreciated. Neurologic:  Normal speech and language. No gross focal neurologic deficits are appreciated.  Skin:  Skin is warm, dry and intact. No rash noted.   ____________________________________________   LABS (all labs ordered are listed, but only abnormal results are displayed)  Labs Reviewed - No data to display ____________________________________________  EKG   ____________________________________________  RADIOLOGY  No results found.  ____________________________________________    PROCEDURES  Procedure(s) performed:    Procedures    Medications  clindamycin (CLEOCIN) injection 600 mg (600 mg Intramuscular Given 07/08/17 1416)     ____________________________________________   INITIAL IMPRESSION / ASSESSMENT AND PLAN / ED COURSE  Pertinent labs & imaging results that were available during my care of the patient were reviewed by me and considered in my medical decision making (see chart for details).  Review of the [place] CSRS was performed in accordance of the [facility] prior to dispensing any controlled  drugs.   Patient's diagnosis is consistent with dental abscess. Vital signs and exam are reassuring. IM clindamycin was given. Patient will be discharged home with prescriptions for clindamycin and viscous lidocain. Patient is to follow up with dentist as directed. Dental resources were given. Patient is given ED precautions to return to the ED for any worsening or new symptoms.   ____________________________________________  FINAL CLINICAL IMPRESSION(S) / ED DIAGNOSES  Final diagnoses:  Dental abscess      NEW MEDICATIONS STARTED DURING THIS VISIT:  Discharge Medication List as of 07/08/2017  1:38 PM    START taking these medications   Details  clindamycin (CLEOCIN) 300 MG capsule Take 1 capsule (300 mg total) by mouth 4 (four) times daily., Starting Tue 07/08/2017, Until Fri 07/18/2017, Print            This chart was dictated using voice recognition software/Dragon. Despite best efforts to proofread, errors can occur which can change the meaning. Any change  was purely unintentional.    Enid Derry, PA-C 07/08/17 1514    Sharman Cheek, MD 07/10/17 [id number]

## 2017-07-08 NOTE — ED Notes (Addendum)
Patient does not appear to be in any acute distress at time of discharge. Patient ambulatory to lobby with steady gate. Patient denies any comments or concerns regarding discharge. Denies adverse reaction to antibiotics

## 2017-07-08 NOTE — ED Triage Notes (Signed)
Pt arrives with dental pain and swelling to left jaw line, states she belieevs she has a dental abscess

## 2017-07-08 NOTE — Discharge Instructions (Signed)
OPTIONS FOR DENTAL FOLLOW UP CARE ° °[place] [facility] and [facility] - Local Safety Net Dental Clinics °http://www.ncdhhs.gov/dph/oralhealth/services/safetynetclinics.htm °  °[facility] (336-562-3123) ° °[facility] [place] ([phone]) ° °[facility] ([phone] ext [phone]) ° °[facility] ([phone]) ° °[facility] ([phone]) °This clinic caters to the indigent population and is on a lottery system. °Location: °[facility], [address], [place] °Clinic Hours: °Wednesdays from 6pm - 9pm, patients seen by a lottery system. °For dates, call or go to [email or web address].[facility].edu/[facility]/patients/Dental-[facility] °Services: °Cleanings, fillings and simple extractions. °Payment Options: °DENTAL WORK IS FREE OF CHARGE. Bring proof of income or support. °Best way to get seen: °Arrive at 5:15 pm - this is a lottery, NOT first come/first serve, so arriving earlier will not increase your chances of being seen. °  °  °[facility] °[phone] °Select option 1 for emergencies °  °Location: °[facility], [address], [place] °Clinic Hours: °No walk-ins accepted - call the day before to schedule an appointment. °Check in times are 9:30 am and 1:30 pm. °Services: °Simple extractions, temporary fillings, pulpectomy/pulp debridement, uncomplicated abscess drainage. °Payment Options: °PAYMENT IS DUE AT THE TIME OF SERVICE.  Fee is usually $100-200, additional surgical procedures (e.g. abscess drainage) may be extra. °Cash, checks, Visa/MasterCard accepted.  Can file Medicaid if patient is covered for dental - patient should call case worker to check. °No discount for [facility] patients. °Best way to get seen: °MUST call the day before and get onto the schedule. Can usually be seen the next 1-2 days. No walk-ins accepted. °  °  °[facility] °[phone] °   °Location: °[facility], [address], [place] °Clinic Hours: °M, W, Th, F 8am or 1:30pm, Tues 9a or 1:30 - first come/first served. °Services: °Simple extractions, temporary fillings, uncomplicated abscess drainage.  You do not need to be an [place] resident. °Payment Options: °PAYMENT IS DUE AT THE TIME OF SERVICE. °Dental insurance, otherwise sliding scale - bring proof of income or support. °Depending on income and treatment needed, cost is usually $50-200. °Best way to get seen: °Arrive early as it is first come/first served. °  °  °[facility] °[phone] °  °Location: °[address] °Clinic Hours: °Mon-Thu 8a-5p °Services: °Most basic dental services including extractions and fillings. °Payment Options: °PAYMENT IS DUE AT THE TIME OF SERVICE. °Sliding scale, up to 50% off - bring proof if income or support. °Medicaid with dental option accepted. °Best way to get seen: °Call to schedule an appointment, can usually be seen within 2 weeks OR they will try to see walk-ins - show up at 8a or 2p (you may have to wait). °  °  °[facility] °919-245-2435 °[place] RESIDENTS ONLY °  °Location: °[facility], [address], [place], [place] [address] °Clinic Hours: By appointment only. °Monday - Thursday 8am-5pm, Friday 8am-12pm °Services: Cleanings, fillings, extractions. °Payment Options: °PAYMENT IS DUE AT THE TIME OF SERVICE. °Cash, Visa or MasterCard. Sliding scale - $30 minimum per service. °Best way to get seen: °Come in to office, complete packet and make an appointment - need proof of income °or support monies for each household member and proof of [place] residence. °Usually takes about a month to get in. °  °  °[facility] °[phone] °  °Location: °[address]   [place] °Clinic Hours: Walk-in Urgent Care Dental Services are offered Monday-Friday  mornings only. °The numbers of emergencies accepted daily is limited to the number of °providers available. °Maximum 15 - Mondays, Wednesdays & Thursdays °Maximum 10 - Tuesdays & Fridays °Services: °You do not need to be a [place] resident to be seen for a dental emergency. °Emergencies are defined as pain, swelling, abnormal bleeding, or dental trauma. Walkins will receive x-rays if needed. °NOTE: Dental cleaning is not an emergency. °Payment Options: °PAYMENT IS DUE AT THE TIME OF SERVICE. °Minimum co-pay is $40.00 for uninsured patients. °Minimum co-pay is $3.00 for Medicaid with dental coverage. °Dental Insurance is accepted and must be presented at time of visit. °Medicare does not cover dental. °Forms of payment: Cash, credit card, checks. °Best way to get seen: °If not previously registered with the clinic, walk-in dental registration begins at 7:15 am and is on a first come/first serve basis. °If previously registered with the clinic, call to make an appointment. °  °  °[facility] °[phone] °[place] RESIDENTS ONLY °  °Location: °[address], [place], [place] °Clinic Hours: °Mon-Thu 10a-2p °Services: Extractions only! °Payment Options: °FREE (donations accepted) - bring proof of income or support °Best way to get seen: °Call and schedule an appointment OR come at 8am on the 1st Monday of every month (except for holidays) when it is first come/first served. °  °  °[facility] °[phone] °  °Location: °[address], [place] °Clinic Hours: °Friday mornings °Services, Payment Options, Best way to get seen: °Call for info °

## 2017-10-16 ENCOUNTER — Emergency Department
Admission: EM | Admit: 2017-10-16 | Discharge: 2017-10-16 | Disposition: A | Discharge status: Home / Self Care | Payer: Self-pay | Attending: Emergency Medicine | Admitting: Emergency Medicine

## 2017-10-16 ENCOUNTER — Other Ambulatory Visit: Payer: Self-pay

## 2017-10-16 DIAGNOSIS — K0889 Other specified disorders of teeth and supporting structures: Secondary | ICD-10-CM | POA: Insufficient documentation

## 2017-10-16 MED ORDER — IBUPROFEN 600 MG PO TABS: 600.0000 mg | ORAL_TABLET | Freq: Three times a day (TID) | ORAL | 0 refills | Status: DC | PRN

## 2017-10-16 MED ORDER — AMOXICILLIN 500 MG PO CAPS: 500.0000 mg | ORAL_CAPSULE | Freq: Three times a day (TID) | ORAL | 0 refills | Status: DC

## 2017-10-16 NOTE — ED Provider Notes (Signed)
[facility] Emergency Department Provider Note  ___________________________________________   First MD Initiated Contact with Patient 10/16/17 [phone]     (approximate)  I have reviewed the triage vital signs and the nursing notes.   HISTORY  Chief Complaint Dental Pain   HPI Candace Rivera is a 32 y.o. female is here with complaint of left-sided dental pain.  Patient states his been hurting for a couple days.  She does not have a regular dentist.  She denies any fever or chills.  She has not been taking any over-the-counter medication thus far.  History reviewed. No pertinent past medical history.  There are no active problems to display for this patient.   Past Surgical History:  Procedure Laterality Date  . ABSCESS DRAINAGE    . APPENDECTOMY      Prior to Admission medications   Medication Sig Start Date End Date Taking? Authorizing Provider  amoxicillin (AMOXIL) 500 MG capsule Take 1 capsule (500 mg total) by mouth 3 (three) times daily. 10/16/17   Tommi Rumps, PA-C  ibuprofen (ADVIL,MOTRIN) 600 MG tablet Take 1 tablet (600 mg total) by mouth every 8 (eight) hours as needed. 10/16/17   Tommi Rumps, PA-C    Allergies Patient has no known allergies.  No family history on file.  Social History Social History   Tobacco Use  . Smoking status: Never Smoker  . Smokeless tobacco: Never Used  Substance Use Topics  . Alcohol use: Yes    Comment: occ  . Drug use: No    Review of Systems Constitutional: No fever/chills Eyes: No visual changes. ENT: Positive for dental pain. Cardiovascular: Denies chest pain. Respiratory: Denies shortness of breath. Gastrointestinal:   No nausea, no vomiting.   Musculoskeletal: Negative for musculoskeletal pain. Skin: Negative for rash. Neurological: Negative for headaches ____________________________________________   PHYSICAL EXAM:  VITAL SIGNS: ED Triage Vitals  Enc Vitals Group   BP 10/16/17 0923 126/85     Pulse Rate 10/16/17 0923 84     Resp 10/16/17 0923 20     Temp 10/16/17 0923 98.7 F (37.1 C)     Temp Source 10/16/17 0923 Oral     SpO2 10/16/17 0923 100 %     Weight 10/16/17 0924 99 lb (44.9 kg)     Height 10/16/17 0924 5\' 5"  (1.651 m)     Head Circumference --      Peak Flow --      Pain Score --      Pain Loc --      Pain Edu? --      Excl. in GC? --    Constitutional: Alert and oriented. Well appearing and in no acute distress. Eyes: Conjunctivae are normal. PERRL. EOMI. Head: Atraumatic. Nose: No congestion/rhinnorhea. Mouth/Throat: Mucous membranes are moist.  Oropharynx non-erythematous.  Left upper molar and premolar with poor dental repair.  Wisdom tooth is down into the gumline.  No active drainage is noted.  Gums are swollen. Neck: No stridor.   Cardiovascular: Normal rate, regular rhythm. Grossly normal heart sounds.  Good peripheral circulation. Respiratory: Normal respiratory effort.  No retractions. Lungs CTAB. Musculoskeletal: No lower extremity tenderness nor edema.  No joint effusions. Neurologic:  Normal speech and language. No gross focal neurologic deficits are appreciated.  Skin:  Skin is warm, dry and intact. Psychiatric: Mood and affect are normal. Speech and behavior are normal.  ____________________________________________   LABS (all labs ordered are listed, but only abnormal results are displayed)  Labs  Reviewed - No data to display ____________________________________________   PROCEDURES  Procedure(s) performed: None  Procedures  Critical Care performed: No  ____________________________________________   INITIAL IMPRESSION / ASSESSMENT AND PLAN / ED COURSE Patient was given a list of dental clinics in the area to contact for dental care.  She was given a prescription for amoxicillin 500 mg 3 times daily for 10 days and ibuprofen every 8 hours as needed for  pain. ____________________________________________   FINAL CLINICAL IMPRESSION(S) / ED DIAGNOSES  Final diagnoses:  Pain, dental     ED Discharge Orders        Ordered    amoxicillin (AMOXIL) 500 MG capsule  3 times daily     10/16/17 1058    ibuprofen (ADVIL,MOTRIN) 600 MG tablet  Every 8 hours PRN     10/16/17 1058       Note:  This document was prepared using Dragon voice recognition software and may include unintentional dictation errors.    Tommi RumpsSummers, Auriah Hollings L, PA-C 10/16/17 1233    Minna AntisPaduchowski, Kevin, MD 10/16/17 1408

## 2017-10-16 NOTE — ED Notes (Signed)
See triage note  Presents with dental pain  States she lost a filling to left upper tooth  And also developed some gum swelling to lower

## 2017-10-16 NOTE — ED Triage Notes (Signed)
Pt c/o two teeth on the left side bothering her for the past couple of days.

## 2017-10-16 NOTE — Discharge Instructions (Signed)
Been taking antibiotics as directed.  Amoxicillin 500 mg 3 times daily for 10 days and ibuprofen as needed for pain.  Look over the dental clinics listed on her discharge papers for dental care.   OPTIONS FOR DENTAL FOLLOW UP CARE  [place] Department of Health and Human Services - Local Safety Net Dental Clinics [email or web address]://www.ncdhhs.gov/dph/oralhealth/services/safetynetclinics.htm   [facility] [phone])  [facility] [phone])  [facility] [phone]  [facility] [phone])  [facility] [phone]) This clinic caters to the indigent population and is on a lottery system. Location: [employer] [address] [facility], [address], [place] Clinic Hours: Wednesdays from 6pm - 9pm, patients seen by a lottery system. For dates, call or go to [email or web address] Services: Cleanings, fillings and simple extractions. Payment Options: DENTAL WORK IS FREE OF CHARGE. Bring proof of income or support. Best way to get seen: Arrive at 5:15 pm - this is a lottery, NOT first come/first serve, so arriving earlier will not increase your chances of being seen.     [facility] [phone] Select option 1 for emergencies   Location: [facility], [address], [place] Clinic Hours: No walk-ins accepted - call the day before to schedule an appointment. Check in times are 9:30 am and 1:30 pm. Services: Simple extractions, temporary fillings, pulpectomy/pulp debridement, uncomplicated abscess drainage. Payment Options: PAYMENT IS DUE AT THE TIME OF SERVICE.  Fee is usually $100-200, additional surgical procedures (e.g. abscess drainage) may be extra. Cash, checks, Visa/MasterCard accepted.  Can file Medicaid if patient is covered for dental - patient should call case worker to check. No discount for [facility]  patients. Best way to get seen: MUST call the day before and get onto the schedule. Can usually be seen the next 1-2 days. No walk-ins accepted.     [facility] [phone]   Location: [facility], [address], [place] [facility] Hours: M, W, Th, F 8am or 1:30pm, Tues 9a or 1:30 - first come/first served. Services: Simple extractions, temporary fillings, uncomplicated abscess drainage.  You do not need to be an [place] resident. Payment Options: PAYMENT IS DUE AT THE TIME OF SERVICE. Dental insurance, otherwise sliding scale - bring proof of income or support. Depending on income and treatment needed, cost is usually $50-200. Best way to get seen: Arrive early as it is first come/first served.     [facility] [phone]   Location: [address] Clinic Hours: Mon-Thu 8a-5p Services: Most basic dental services including extractions and fillings. Payment Options: PAYMENT IS DUE AT THE TIME OF SERVICE. Sliding scale, up to 50% off - bring proof if income or support. Medicaid with dental option accepted. Best way to get seen: Call to schedule an appointment, can usually be seen within 2 weeks OR they will try to see walk-ins - show up at 8a or 2p (you may have to wait).     [facility] [phone] [place] RESIDENTS ONLY   Location: [facility], [address], [place], [place] [id number] Clinic Hours: By appointment only. Monday - Thursday 8am-5pm, Friday 8am-12pm Services: Cleanings, fillings, extractions. Payment Options: PAYMENT IS DUE AT THE TIME OF SERVICE. Cash, Visa or MasterCard. Sliding scale - $30 minimum per service. Best way to get seen: Come in to office, complete packet and make an appointment - need proof of income  or support monies for each household member and proof of [facility] residence. Usually takes about a month to  get in.     [facility] [phone]   Location: [address], [place] Clinic Hours: Walk-in Urgent Care Dental Services are offered Monday-Friday mornings only. The numbers of emergencies accepted daily is limited to the number of providers available. Maximum 15 - Mondays, Wednesdays & Thursdays Maximum 10 - Tuesdays & Fridays Services: You do not need to be a [facility] resident to be seen for a dental emergency. Emergencies are defined as pain, swelling, abnormal bleeding, or dental trauma. Walkins will receive x-rays if needed. NOTE: Dental cleaning is not an emergency. Payment Options: PAYMENT IS DUE AT THE TIME OF SERVICE. Minimum co-pay is $40.00 for uninsured patients. Minimum co-pay is $3.00 for Medicaid with dental coverage. Dental Insurance is accepted and must be presented at time of visit. Medicare does not cover dental. Forms of payment: Cash, credit card, checks. Best way to get seen: If not previously registered with the clinic, walk-in dental registration begins at 7:15 am and is on a first come/first serve basis. If previously registered with the clinic, call to make an appointment.     [facility] [employer] [place] ONLY   Location: [address] [place], [place] Clinic Hours: Mon-Thu 10a-2p Services: Extractions only! Payment Options: FREE (donations accepted) - bring proof of income or support Best way to get seen: Call and schedule an appointment OR come at 8am on the 1st Monday of every month (except for holidays) when it is first come/first served.     [facility] [phone]   Location: [place], [place] Clinic Hours: Friday mornings Services, Payment Options, Best way to get seen: Call for info

## 2017-12-21 ENCOUNTER — Emergency Department
Admission: EM | Admit: 2017-12-21 | Discharge: 2017-12-21 | Disposition: A | Discharge status: Home / Self Care | Payer: Self-pay | Attending: Emergency Medicine | Admitting: Emergency Medicine

## 2017-12-21 ENCOUNTER — Encounter: Payer: Self-pay | Admitting: Emergency Medicine

## 2017-12-21 DIAGNOSIS — G5602 Carpal tunnel syndrome, left upper limb: Secondary | ICD-10-CM | POA: Insufficient documentation

## 2017-12-21 MED ORDER — NAPROXEN 500 MG PO TBEC: 500.0000 mg | DELAYED_RELEASE_TABLET | Freq: Two times a day (BID) | ORAL | 0 refills | Status: DC

## 2017-12-21 NOTE — ED Triage Notes (Signed)
Pt reports left hand swelling and pain with movement. Pt reports sx x1 month, reports sx worse in the morning. Describes pain as "when your hand falls asleep". Reports taking ibuprofen without relief. Works a job with a lot of hand repetition.

## 2017-12-21 NOTE — Discharge Instructions (Addendum)
Follow-up with Dr. Odis LusterBowers who is the orthopedist on call.  Wear cockup wrist splint for protection and support.  Begin taking naproxen 500 mg twice daily with food.  You may also use ice to the area as needed for discomfort.

## 2017-12-21 NOTE — ED Provider Notes (Signed)
[facility] Emergency Department Provider Note  ____________________________________________   First MD Initiated Contact with Patient 12/21/17 1241     (approximate)  I have reviewed the triage vital signs and the nursing notes.   HISTORY  Chief Complaint Numbness   HPI Candace Rivera is a 32 y.o. female is here with complaint of left hand pain and swelling for approximately 1 month.  Patient states it is worse first thing in the mornings.  She states that it feels like "my hands asleep".  She also has noticed that her grip is less in her left hand than in her right.  Patient has been taking ibuprofen without any relief.  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  She denies any injury to her hand.  Currently she rates her pain as a 6 out of 10.  History reviewed. No pertinent past medical history.  There are no active problems to display for this patient.   Past Surgical History:  Procedure Laterality Date  . ABSCESS DRAINAGE    . APPENDECTOMY      Prior to Admission medications   Medication Sig Start Date End Date Taking? Authorizing Provider  naproxen (EC NAPROSYN) 500 MG EC tablet Take 1 tablet (500 mg total) by mouth 2 (two) times daily with a meal. 12/21/17 12/21/18  Tommi Rumps, PA-C    Allergies Patient has no known allergies.  No family history on file.  Social History Social History   Tobacco Use  . Smoking status: Never Smoker  . Smokeless tobacco: Never Used  Substance Use Topics  . Alcohol use: Yes    Comment: occ  . Drug use: No    Review of Systems Constitutional: No fever/chills Cardiovascular: Denies chest pain. Respiratory: Denies shortness of breath. Musculoskeletal: Negative for back pain. Skin: Negative for rash. Neurological: Negative for headaches, focal weakness.  Left hand paresthesia positive. ____________________________________________   PHYSICAL EXAM:  VITAL SIGNS: ED Triage  Vitals  Enc Vitals Group     BP 12/21/17 1148 119/76     Pulse Rate 12/21/17 1148 99     Resp 12/21/17 1148 14     Temp 12/21/17 1148 98.4 F (36.9 C)     Temp Source 12/21/17 1148 Oral     SpO2 12/21/17 1148 99 %     Weight 12/21/17 1149 95 lb (43.1 kg)     Height 12/21/17 1149 5\' 4"  (1.626 m)     Head Circumference --      Peak Flow --      Pain Score 12/21/17 1148 6     Pain Loc --      Pain Edu? --      Excl. in GC? --    Constitutional: Alert and oriented. Well appearing and in no acute distress. Eyes: Conjunctivae are normal.  Head: Atraumatic. Neck: No stridor.   Cardiovascular: Normal rate, regular rhythm. Grossly normal heart sounds.  Good peripheral circulation. Respiratory: Normal respiratory effort.  No retractions. Lungs CTAB. Musculoskeletal: No gross deformity is noted on exam of the left hand and wrist.  There is no soft tissue edema and no appreciated atrophy.  Patient is able to move all digits without any difficulty.  Motor sensory function intact.  There is positive Tinel sign.  Skin is intact and no discoloration is noted. Neurologic:  Normal speech and language. No gross focal neurologic deficits are appreciated. No gait instability. Skin:  Skin is warm, dry and intact.  Psychiatric: Mood  and affect are normal. Speech and behavior are normal.  ____________________________________________   LABS (all labs ordered are listed, but only abnormal results are displayed)  Labs Reviewed - No data to display  PROCEDURES  Procedure(s) performed: None  Procedures  Critical Care performed: No  ____________________________________________   INITIAL IMPRESSION / ASSESSMENT AND PLAN / ED COURSE  As part of my medical decision making, I reviewed the following data within the electronic MEDICAL RECORD NUMBER Notes from prior ED visits and [place] Controlled Substance Database  Patient presents with discomfort and decreased grip strength times 1 month.  She has a history  of repetitive motion with her job.  Patient was placed in a cockup wrist splint.  Patient was discharged with naproxen 500 mg twice daily with food.  She is to follow-up with the orthopedist listed on her discharge papers for further evaluation. ____________________________________________   FINAL CLINICAL IMPRESSION(S) / ED DIAGNOSES  Final diagnoses:  Carpal tunnel syndrome, left     ED Discharge Orders        Ordered    naproxen (EC NAPROSYN) 500 MG EC tablet  2 times daily with meals     12/21/17 1338       Note:  This document was prepared using Dragon voice recognition software and may include unintentional dictation errors.    Tommi RumpsSummers, Brindy Higginbotham L, PA-C 12/21/17 1533    Sharyn CreamerQuale, Mark, MD 12/21/17 1705

## 2017-12-21 NOTE — ED Notes (Signed)
See triage note  Presents with pain and swelling to left hand  Pain is worse in the mornings  Min relief with ibu    Denies any specific injury but repetitive work

## 2018-02-14 ENCOUNTER — Other Ambulatory Visit: Payer: Self-pay

## 2018-02-14 ENCOUNTER — Emergency Department
Admission: EM | Admit: 2018-02-14 | Discharge: 2018-02-14 | Disposition: A | Discharge status: Home / Self Care | Payer: Self-pay | Attending: Emergency Medicine | Admitting: Emergency Medicine

## 2018-02-14 DIAGNOSIS — K0889 Other specified disorders of teeth and supporting structures: Secondary | ICD-10-CM | POA: Insufficient documentation

## 2018-02-14 MED ORDER — AMOXICILLIN 500 MG PO TABS: 500.0000 mg | ORAL_TABLET | Freq: Three times a day (TID) | ORAL | 0 refills | Status: DC

## 2018-02-14 NOTE — ED Provider Notes (Signed)
[facility] Emergency Department Provider Note ____________________________________________  Time seen: Approximately 2:49 PM  I have reviewed the triage vital signs and the nursing notes.   HISTORY  Chief Complaint Dental Pain   HPI Candace Rivera is a 32 y.o. female who presents to the emergency department for treatment and evaluation of dental pain.   Pain started 3 days ago.  She has taken 2 doses of amoxicillin, tramadol, meloxicam, Tylenol, and ibuprofen with little to no relief.  No known fever.  No past medical history on file.  There are no active problems to display for this patient.   Past Surgical History:  Procedure Laterality Date  . ABSCESS DRAINAGE    . APPENDECTOMY      Prior to Admission medications   Medication Sig Start Date End Date Taking? Authorizing Provider  amoxicillin (AMOXIL) 500 MG tablet Take 1 tablet (500 mg total) by mouth 3 (three) times daily. 02/14/18   Emmilia Sowder, Rulon Eisenmenger B, FNP  naproxen (EC NAPROSYN) 500 MG EC tablet Take 1 tablet (500 mg total) by mouth 2 (two) times daily with a meal. 12/21/17 12/21/18  Tommi Rumps, PA-C    Allergies Oxycodone  No family history on file.  Social History Social History   Tobacco Use  . Smoking status: Never Smoker  . Smokeless tobacco: Never Used  Substance Use Topics  . Alcohol use: Yes    Comment: occ  . Drug use: No    Review of Systems Constitutional: Negative for fever  ENT: Positive for dental pain Musculoskeletal: Positive for right lower jaw pain  Skin: Negative for facial swelling. ____________________________________________   PHYSICAL EXAM:  VITAL SIGNS: ED Triage Vitals  Enc Vitals Group     BP 02/14/18 1308 126/82     Pulse Rate 02/14/18 1308 91     Resp 02/14/18 1308 18     Temp 02/14/18 1308 98.9 F (37.2 C)     Temp Source 02/14/18 1308 Oral     SpO2 02/14/18 1308 100 %     Weight 02/14/18 1308 100 lb (45.4 kg)     Height 02/14/18 1308 5'  5" (1.651 m)     Head Circumference --      Peak Flow --      Pain Score 02/14/18 1316 10     Pain Loc --      Pain Edu? --      Excl. in GC? --     Constitutional: Alert and oriented. Well appearing and in no acute distress. Eyes: No discharge or drainage. Mouth/Throat:  Periodontal Exam    Hematological/Lymphatic/Immunilogical: No palpable anterior cervical nodes. Respiratory: Respirations even and unlabored. Musculoskeletal: Full ROM of jaw Neurologic: Awake, alert, and oriented x 4.  Skin:  No facial erythema or edema Psychiatric: Affect and behavior are appropriate.  ____________________________________________   LABS (all labs ordered are listed, but only abnormal results are displayed)  Labs Reviewed - No data to display ____________________________________________   RADIOLOGY  Not indicated. ____________________________________________   PROCEDURES  Procedure(s) performed:   Procedures  Critical Care performed: No ____________________________________________   INITIAL IMPRESSION / ASSESSMENT AND PLAN / ED COURSE  Candace Rivera is a 32 y.o. female who presents to the emergency department for treatment and evaluation of dental pain.  She will be given a prescription for amoxicillin and advised to continue her other medications as prescribed.  She was encouraged to schedule an appointment with a dentist as soon as possible.  She was advised to  return to the emergency department for symptoms of change or worsen if she is unable to schedule an appointment.  Pertinent labs & imaging results that were available during my care of the patient were reviewed by me and considered in my medical decision making (see chart for details).  ____________________________________________   FINAL CLINICAL IMPRESSION(S) / ED DIAGNOSES  Final diagnoses:  Pain, dental    Discharge Medication List as of 02/14/2018  2:34 PM    START taking these medications   Details    amoxicillin (AMOXIL) 500 MG tablet Take 1 tablet (500 mg total) by mouth 3 (three) times daily., Starting Sat 02/14/2018, Print        If controlled substance prescribed during this visit, 12 month history viewed on the NCCSRS prior to issuing an initial prescription for Schedule II or III opiod.  Note:  This document was prepared using Dragon voice recognition software and may include unintentional dictation errors.    Chinita Pesterriplett, Dyanne Yorks B, FNP 02/14/18 1454    Dionne BucySiadecki, Sebastian, MD 02/14/18 1545

## 2018-02-14 NOTE — ED Notes (Signed)
Pt states her back tooth is causing her severe pain. Pt denies any drainage and also states it is making her ear ache.

## 2018-02-14 NOTE — Discharge Instructions (Signed)
Brush well and apply DenTemp for a temporary filling. Take the pain medications you are already on in addition to the amoxicillin. Rinse your mouth with warm salt water 4 times per day.

## 2018-02-14 NOTE — ED Triage Notes (Signed)
Pt reports bottom right wisdom tooth pain for 3 days, tooth is broken, pt states that she has taken 2 amoxicillin left over from a previous dental issue, tramadol, meloxicam, tylenol, and ibuprofen without relief

## 2018-02-16 ENCOUNTER — Encounter: Payer: Self-pay | Admitting: Emergency Medicine

## 2018-02-16 ENCOUNTER — Other Ambulatory Visit: Payer: Self-pay

## 2018-02-16 ENCOUNTER — Emergency Department
Admission: EM | Admit: 2018-02-16 | Discharge: 2018-02-17 | Disposition: A | Discharge status: Home / Self Care | Payer: Self-pay | Attending: Emergency Medicine | Admitting: Emergency Medicine

## 2018-02-16 DIAGNOSIS — F4325 Adjustment disorder with mixed disturbance of emotions and conduct: Secondary | ICD-10-CM

## 2018-02-16 DIAGNOSIS — X789XXA Intentional self-harm by unspecified sharp object, initial encounter: Secondary | ICD-10-CM | POA: Insufficient documentation

## 2018-02-16 DIAGNOSIS — Y929 Unspecified place or not applicable: Secondary | ICD-10-CM | POA: Insufficient documentation

## 2018-02-16 DIAGNOSIS — T148XXA Other injury of unspecified body region, initial encounter: Secondary | ICD-10-CM

## 2018-02-16 DIAGNOSIS — S61519A Laceration without foreign body of unspecified wrist, initial encounter: Secondary | ICD-10-CM

## 2018-02-16 DIAGNOSIS — S60812A Abrasion of left wrist, initial encounter: Secondary | ICD-10-CM | POA: Insufficient documentation

## 2018-02-16 DIAGNOSIS — R45851 Suicidal ideations: Secondary | ICD-10-CM | POA: Insufficient documentation

## 2018-02-16 DIAGNOSIS — Y939 Activity, unspecified: Secondary | ICD-10-CM | POA: Insufficient documentation

## 2018-02-16 DIAGNOSIS — Y999 Unspecified external cause status: Secondary | ICD-10-CM | POA: Insufficient documentation

## 2018-02-16 LAB — CBC
HCT: 39.5 % (ref 35.0–47.0)
Hemoglobin: 13.4 g/dL (ref 12.0–16.0)
MCH: 31.3 pg (ref 26.0–34.0)
MCHC: 34 g/dL (ref 32.0–36.0)
MCV: 92.2 fL (ref 80.0–100.0)
PLATELETS: 262 10*3/uL (ref 150–440)
RBC: 4.28 MIL/uL (ref 3.80–5.20)
RDW: 13 % (ref 11.5–14.5)
WBC: 9.3 10*3/uL (ref 3.6–11.0)

## 2018-02-16 LAB — COMPREHENSIVE METABOLIC PANEL
ALT: 10 U/L — AB (ref 14–54)
AST: 19 U/L (ref 15–41)
Albumin: 4.1 g/dL (ref 3.5–5.0)
Alkaline Phosphatase: 56 U/L (ref 38–126)
Anion gap: 9 (ref 5–15)
BUN: 6 mg/dL (ref 6–20)
CHLORIDE: 108 mmol/L (ref 101–111)
CO2: 25 mmol/L (ref 22–32)
CREATININE: 0.72 mg/dL (ref 0.44–1.00)
Calcium: 9 mg/dL (ref 8.9–10.3)
GFR calc Af Amer: >60 mL/min (ref >60)
Glucose, Bld: 93 mg/dL (ref 65–99)
Potassium: 3.3 mmol/L — ABNORMAL LOW (ref 3.5–5.1)
Sodium: 142 mmol/L (ref 135–145)
Total Bilirubin: 0.5 mg/dL (ref 0.3–1.2)
Total Protein: 8.1 g/dL (ref 6.5–8.1)

## 2018-02-16 LAB — POCT PREGNANCY, URINE: Preg Test, Ur: NEGATIVE

## 2018-02-16 NOTE — ED Notes (Signed)
Pt dressed out in burgundy scrubs.  Labs drawn and sent to lab.  Pt could not give UA sample and was asked to give one as soon as possible.  Cup provided.  Pts belongings, which include pants, underwear, shirt, bra, black purse.  Iphone and pink watch placed in her purse.  Pt had 1 gold colored toe ring, 1 pair of stud earrings, 2 necklaces that was placed in a cup and placed in her purse.  Pt ambulated to rm 19H

## 2018-02-16 NOTE — ED Triage Notes (Signed)
Patient ambulatory to triage with steady gait, without difficulty or distress noted, in custody of [place] [employer] for IVC; pt reports she has no idea why she is here; PD reports pt holding razor blade to her wrist; pt admits to SI

## 2018-02-16 NOTE — ED Notes (Signed)
Misty StanleyLisa, EDT, to triage to change pt into behav scrubs; black pants, red bra, tank top, pink panties and black purse placed in labeled pt belonging bag to be secured on nursing unit; pt changed into burgandy scrubs

## 2018-02-17 DIAGNOSIS — S61519A Laceration without foreign body of unspecified wrist, initial encounter: Secondary | ICD-10-CM

## 2018-02-17 DIAGNOSIS — X789XXA Intentional self-harm by unspecified sharp object, initial encounter: Secondary | ICD-10-CM

## 2018-02-17 DIAGNOSIS — F4325 Adjustment disorder with mixed disturbance of emotions and conduct: Secondary | ICD-10-CM

## 2018-02-17 LAB — URINE DRUG SCREEN, QUALITATIVE (ARMC ONLY)
AMPHETAMINES, UR SCREEN: NOT DETECTED
BARBITURATES, UR SCREEN: NOT DETECTED
BENZODIAZEPINE, UR SCRN: NOT DETECTED
Cannabinoid 50 Ng, Ur ~~LOC~~: NOT DETECTED
Cocaine Metabolite,Ur ~~LOC~~: NOT DETECTED
MDMA (Ecstasy)Ur Screen: NOT DETECTED
METHADONE SCREEN, URINE: NOT DETECTED
OPIATE, UR SCREEN: NOT DETECTED
Phencyclidine (PCP) Ur S: NOT DETECTED
TRICYCLIC, UR SCREEN: NOT DETECTED

## 2018-02-17 LAB — ACETAMINOPHEN LEVEL

## 2018-02-17 LAB — SALICYLATE LEVEL

## 2018-02-17 LAB — ETHANOL

## 2018-02-17 NOTE — ED Notes (Signed)
IVC PAPERS  RESCINDED  PER  DR  CLAPACS  MD  INFORMED  RN  AMY  TEAGUE

## 2018-02-17 NOTE — ED Notes (Signed)
Patient offered breakfast tray, she refused at this time: went back to sleep, will continue to monitor for safety.

## 2018-02-17 NOTE — ED Provider Notes (Signed)
The patient has been evaluated at bedside by Dr. Toni Amendlapacs, psychiatry.  Patient is clinically stable.  Not felt to be a danger to self or others.  No SI or Hi.  No indication for inpatient psychiatric admission at this time.  Appropriate for continued outpatient therapy.    Willy Eddyobinson, Aboubacar Matsuo, MD 02/17/18 1057

## 2018-02-17 NOTE — ED Notes (Signed)
ED  Is the patient under IVC or is there intent for IVC: Yes.   Is the patient medically cleared: Yes.   Is there vacancy in the ED BHU: Yes.   Is the population mix appropriate for patient: Yes.   Is the patient awaiting placement in inpatient or outpatient setting: Yes.   Has the patient had a psychiatric consult:  pending Survey of unit performed for contraband, proper placement and condition of furniture, tampering with fixtures in bathroom, shower, and each patient room: Yes.  ; Findings:  APPEARANCE/BEHAVIOR Calm and cooperative NEURO ASSESSMENT Orientation: oriented x3  Denies pain Hallucinations: No.None noted (Hallucinations) Speech: Normal Gait: normal RESPIRATORY ASSESSMENT Even  Unlabored respirations  CARDIOVASCULAR ASSESSMENT Pulses equal   regular rate  Skin warm and dry   GASTROINTESTINAL ASSESSMENT no GI complaint EXTREMITIES Full ROM  PLAN OF CARE Provide calm/safe environment. Vital signs assessed twice daily. ED BHU Assessment once each 12-hour shift. Collaborate with TTS daily or as condition indicates. Assure the ED provider has rounded once each shift. Provide and encourage hygiene. Provide redirection as needed. Assess for escalating behavior; address immediately and inform ED provider.  Assess family dynamic and appropriateness for visitation as needed: Yes.  ; If necessary, describe findings:  Educate the patient/family about BHU procedures/visitation: Yes.  ; If necessary, describe findings:

## 2018-02-17 NOTE — ED Provider Notes (Signed)
[facility] Emergency Department Provider Note   ____________________________________________   First MD Initiated Contact with Patient 02/16/18 2346     (approximate)  I have reviewed the triage vital signs and the nursing notes.   HISTORY  Chief Complaint Mental Health Problem    HPI Candace Rivera is a 32 y.o. female who comes into the hospital today after trying to cut herself.  The patient states that her girlfriend broke up with her and she was upset.  She reports that she was trying to kill herself.  She states that she has thought about it before but has never attempted.  She is never been hospitalized and she states it is never been this extreme.  The patient denies drinking or doing any drugs tonight to bring on these actions.  She is here today for evaluation.  The patient came under involuntary commitment.   History reviewed. No pertinent past medical history.  There are no active problems to display for this patient.   Past Surgical History:  Procedure Laterality Date  . ABSCESS DRAINAGE    . APPENDECTOMY      Prior to Admission medications   Not on File    Allergies Codeine and Oxycodone  No family history on file.  Social History Social History   Tobacco Use  . Smoking status: Never Smoker  . Smokeless tobacco: Never Used  Substance Use Topics  . Alcohol use: Yes    Comment: occ  . Drug use: No    Review of Systems  Constitutional: No fever/chills Eyes: No visual changes. ENT: No sore throat. Cardiovascular: Denies chest pain. Respiratory: Denies shortness of breath. Gastrointestinal: No abdominal pain.  No nausea, no vomiting.  No diarrhea.  No constipation. Genitourinary: Negative for dysuria. Musculoskeletal: Negative for back pain. Skin: Linear abrasion  Neurological: Negative for headaches, focal weakness or numbness. Psych: Suicidal ideation  ____________________________________________   PHYSICAL  EXAM:  VITAL SIGNS: ED Triage Vitals [02/16/18 2250]  Enc Vitals Group     BP (!) 139/92     Pulse Rate 85     Resp 18     Temp 98.9 F (37.2 C)     Temp Source Oral     SpO2 99 %     Weight 100 lb (45.4 kg)     Height 5\' 5"  (1.651 m)     Head Circumference      Peak Flow      Pain Score 0     Pain Loc      Pain Edu?      Excl. in GC?     Constitutional: Alert and oriented. Well appearing and in mild distress. Eyes: Conjunctivae are normal. PERRL. EOMI. Head: Atraumatic. Nose: No congestion/rhinnorhea. Mouth/Throat: Mucous membranes are moist.  Oropharynx non-erythematous. Cardiovascular: Normal rate, regular rhythm. Grossly normal heart sounds.  Good peripheral circulation. Respiratory: Normal respiratory effort.  No retractions. Lungs CTAB. Gastrointestinal: Soft and nontender. No distention.  Positive bowel sounds Musculoskeletal: No lower extremity tenderness nor edema.   Neurologic:  Normal speech and language.  Skin:  Skin is warm, dry, superficial linear abrasion to left wrist Psychiatric: Mood and affect are normal.   ____________________________________________   LABS (all labs ordered are listed, but only abnormal results are displayed)  Labs Reviewed  COMPREHENSIVE METABOLIC PANEL - Abnormal; Notable for the following components:      Result Value   Potassium 3.3 (*)    ALT 10 (*)    All other components  within normal limits  ACETAMINOPHEN LEVEL - Abnormal; Notable for the following components:   Acetaminophen (Tylenol), Serum <10 (*)    All other components within normal limits  ETHANOL  SALICYLATE LEVEL  CBC  URINE DRUG SCREEN, QUALITATIVE ([facility] ONLY)  POC URINE PREG, ED  POCT PREGNANCY, URINE   ____________________________________________  EKG  none ____________________________________________  RADIOLOGY  ED MD interpretation:  none  Official radiology report(s): No results  found.  ____________________________________________   PROCEDURES  Procedure(s) performed: None  Procedures  Critical Care performed: No  ____________________________________________   INITIAL IMPRESSION / ASSESSMENT AND PLAN / ED COURSE  As part of my medical decision making, I reviewed the following data within the electronic MEDICAL RECORD NUMBER Notes from prior ED visits and [place] Controlled Substance Database   This is a 32 year old female who comes into the hospital today after trying to cut herself after her girlfriend broke up with her. The patient does continue to endorse that she was trying to kill herself. The patient was evaluated by TTS and we will have her seen by psych.   The patient had some blood work drawn which was all unremarkable.      ____________________________________________   FINAL CLINICAL IMPRESSION(S) / ED DIAGNOSES  Final diagnoses:  Suicidal ideation  Abrasion     ED Discharge Orders    None       Note:  This document was prepared using Dragon voice recognition software and may include unintentional dictation errors.    Rebecka ApleyWebster, Allison P, MD 02/17/18 [phone]

## 2018-02-17 NOTE — ED Notes (Signed)
MD Clapacs at bedside  

## 2018-02-17 NOTE — BH Assessment (Signed)
Assessment Note  Candace Rivera is an 32 y.o. female. [place] [facility] [place]  She reports that she had a "Break up gone bad and I tried to hurt myself".  She reports "I am a little stressed and depressed, but nothing else". She reports that there is too much going on and she is easily irritated and easily overwhelmed.  She reports that she is getting less sleep.  She reports worrying about things that she cannot control.  She reports that she has anxiety that was triggered tonight due to her ex yelling at her.  She reports "feeling short of breath and like everybody is in my head at one time".  She denied having auditory or visual hallucinations.  She denied suicidal ideation or intent at this time. She reports that she had thoughts of suicide earlier in the evening when she was still mad, but she has had time to calm down.  She states that she cut herself to "take the pain away and make it easier". She denied homicidal ideation or intent.  She denied the use of alcohol or drugs.  She denied additional stressors.    IVC paperwork reports, "Respondent has prior history and prior suicide attempts.  Tonight she slashed her wrists with a razor knife. She is a danger to herself.  Diagnosis: Depression  Past Medical History: History reviewed. No pertinent past medical history.  Past Surgical History:  Procedure Laterality Date  . ABSCESS DRAINAGE    . APPENDECTOMY      Family History: No family history on file.  Social History:  reports that she has never smoked. She has never used smokeless tobacco. She reports that she drinks alcohol. She reports that she does not use drugs.  Additional Social History:  Alcohol / Drug Use History of alcohol / drug use?: No history of alcohol / drug abuse  CIWA: CIWA-Ar BP: (!) 139/92 Pulse Rate: 85 COWS:    Allergies:  Allergies  Allergen Reactions  . Codeine Nausea Only  . Oxycodone Nausea Only    Home  Medications:  (Not in a hospital admission)  OB/GYN Status:  No LMP recorded.  General Assessment Data [facility] Assessment: In system Is this a Tele or Face-to-Face Assessment?: Face-to-Face Is this an Initial Assessment or a Re-assessment for this encounter?: Initial Assessment Marital status: Single Maiden name: Droessler Is patient pregnant?: No Pregnancy Status: No Living Arrangements: Non-relatives/Friends Can pt return to current living arrangement?: Yes Admission Status: Involuntary Is patient capable of signing voluntary admission?: No Referral Source: Self/Family/Friend Insurance type: None  Medical Screening Exam [facility] Walk-in ONLY) Medical Exam completed: Yes  Crisis Care Plan Living Arrangements: Non-relatives/Friends Legal Guardian: Other:(Self) Name of Psychiatrist: None Name of Therapist: None  Education Status Is patient currently in school?: No Is the patient employed, unemployed or receiving disability?: Employed  Risk to self with the past 6 months Suicidal Ideation: No-Not Currently/Within Last 6 Months Has patient been a risk to self within the past 6 months prior to admission? : Yes Suicidal Intent: No-Not Currently/Within Last 6 Months Has patient had any suicidal intent within the past 6 months prior to admission? : No Is patient at risk for suicide?: No Suicidal Plan?: Yes-Currently Present Has patient had any suicidal plan within the past 6 months prior to admission? : Yes Specify Current Suicidal Plan: Cut her wrist Access to Means: Yes Specify Access to Suicidal Means: has access to  sharp items What has been your use of drugs/alcohol within the last 12 months?: denied use of drugs Previous Attempts/Gestures: No How many times?: 0 Other Self Harm Risks: denied Triggers for Past Attempts: (She denied a history of suicide attempts) Intentional Self Injurious Behavior: None Family Suicide History: No Recent stressful  life event(s): Other (Comment)(stressful breakup) Persecutory voices/beliefs?: No Depression: Yes Depression Symptoms: Feeling angry/irritable Substance abuse history and/or treatment for substance abuse?: No Suicide prevention information given to non-admitted patients: Not applicable  Risk to Others within the past 6 months Homicidal Ideation: No Does patient have any lifetime risk of violence toward others beyond the six months prior to admission? : No Thoughts of Harm to Others: No Current Homicidal Intent: No Current Homicidal Plan: No Access to Homicidal Means: No Identified Victim: None identified History of harm to others?: No Assessment of Violence: None Noted Violent Behavior Description: denied Does patient have access to weapons?: No Criminal Charges Pending?: No Does patient have a court date: No Is patient on probation?: No  Psychosis Hallucinations: None noted Delusions: None noted  Mental Status Report Appearance/Hygiene: In scrubs Eye Contact: Good Motor Activity: Unremarkable Speech: Logical/coherent Level of Consciousness: Alert Mood: Sad Affect: Appropriate to circumstance Anxiety Level: None Thought Processes: Coherent Judgement: Unimpaired Orientation: Appropriate for developmental age Obsessive Compulsive Thoughts/Behaviors: None  Cognitive Functioning Concentration: Normal Memory: Recent Intact Is patient IDD: No Is patient DD?: No Insight: Fair Impulse Control: Poor Appetite: Good Have you had any weight changes? : No Change Sleep: Decreased Vegetative Symptoms: None  ADLScreening [facility] Assessment Services) Patient's cognitive ability adequate to safely complete daily activities?: Yes Patient able to express need for assistance with ADLs?: Yes Independently performs ADLs?: Yes (appropriate for developmental age)  Prior Inpatient Therapy Prior Inpatient Therapy: No  Prior Outpatient Therapy Prior Outpatient Therapy: No Does patient  have an ACCT team?: No Does patient have Intensive In-House Services?  : No Does patient have [facility] services? : No Does patient have P4CC services?: No  ADL Screening (condition at time of admission) Patient's cognitive ability adequate to safely complete daily activities?: Yes Is the patient deaf or have difficulty hearing?: No Does the patient have difficulty seeing, even when wearing glasses/contacts?: No Does the patient have difficulty concentrating, remembering, or making decisions?: No Patient able to express need for assistance with ADLs?: Yes Does the patient have difficulty dressing or bathing?: No Independently performs ADLs?: Yes (appropriate for developmental age) Does the patient have difficulty walking or climbing stairs?: No Weakness of Legs: None Weakness of Arms/Hands: None  Home Assistive Devices/Equipment Home Assistive Devices/Equipment: None    Abuse/Neglect Assessment (Assessment to be complete while patient is alone) Abuse/Neglect Assessment Can Be Completed: (Patient denied any history of abuse)                Disposition:  Disposition Initial Assessment Completed for this Encounter: Yes  On Site Evaluation by:   Reviewed with Physician:    Justice Deeds 02/17/2018 1:24 AM

## 2018-02-17 NOTE — Consult Note (Signed)
[facility] Consult   Reason for Consult: Consult for 32 year old woman brought into the hospital after cutting herself on the wrist Referring Physician: Quentin Cornwall Patient Identification: Candace Rivera MRN:  [id number] Principal Diagnosis: Adjustment disorder with mixed disturbance of emotions and conduct Diagnosis:   Patient Active Problem List   Diagnosis Date Noted  . Adjustment disorder with mixed disturbance of emotions and conduct [F43.25] 02/17/2018  . Self-inflicted laceration of wrist [S61.519A] 02/17/2018    Total Time spent with patient: 1 hour  Subjective:   Candace Rivera is a 32 y.o. female patient admitted with "doing something I should not do".  HPI: Patient seen chart reviewed.  32 year old woman brought into the hospital after cutting herself on the wrist.  Patient says last night she and her girlfriend were having an argument that resulted in a breakup.  Patient became overwhelmed and took a knife and scratched herself extremely superficially on the right wrist.  Her girlfriend called 911 and had the patient brought into the emergency room.  Patient says that prior to the argument her mood had been slightly depressed but not severely so.  She does wake up early at times.  Appetite is been normal.  Denies having had any recent suicidal or homicidal thoughts.  Denies substance abuse.  No hallucinations.  Social history: Has been living with her girlfriend but it appears that they are breaking up.  Patient says she plans to either move to [facility] with her father or back to [place] with her mother.  Medical history: Extraordinarily light scratch to her arm barely visible no other medical problems  Substance abuse history: Denies any alcohol or drug abuse  Past Psychiatric History: Patient has no history of psychiatric treatment never seen a mental health provider never been on any psychiatric medicine.  No prior self injury no hospitalization.  Risk  to Self: Suicidal Ideation: No-Not Currently/Within Last 6 Months Suicidal Intent: No-Not Currently/Within Last 6 Months Is patient at risk for suicide?: No Suicidal Plan?: Yes-Currently Present Specify Current Suicidal Plan: Cut her wrist Access to Means: Yes Specify Access to Suicidal Means: has access to sharp items What has been your use of drugs/alcohol within the last 12 months?: denied use of drugs How many times?: 0 Other Self Harm Risks: denied Triggers for Past Attempts: (She denied a history of suicide attempts) Intentional Self Injurious Behavior: None Risk to Others: Homicidal Ideation: No Thoughts of Harm to Others: No Current Homicidal Intent: No Current Homicidal Plan: No Access to Homicidal Means: No Identified Victim: None identified History of harm to others?: No Assessment of Violence: None Noted Violent Behavior Description: denied Does patient have access to weapons?: No Criminal Charges Pending?: No Does patient have a court date: No Prior Inpatient Therapy: Prior Inpatient Therapy: No Prior Outpatient Therapy: Prior Outpatient Therapy: No Does patient have an ACCT team?: No Does patient have Intensive In-House Services?  : No Does patient have [facility] services? : No Does patient have P4CC services?: No  Past Medical History: History reviewed. No pertinent past medical history.  Past Surgical History:  Procedure Laterality Date  . ABSCESS DRAINAGE    . APPENDECTOMY     Family History: No family history on file. Family Psychiatric  History: Denies any family history Social History:  Social History   Substance and Sexual Activity  Alcohol Use Yes   Comment: occ     Social History   Substance and Sexual Activity  Drug Use No    Social History  Socioeconomic History  . Marital status: Single    Spouse name: Not on file  . Number of children: Not on file  . Years of education: Not on file  . Highest education level: Not on file   Occupational History  . Not on file  Social Needs  . Financial resource strain: Not on file  . Food insecurity:    Worry: Not on file    Inability: Not on file  . Transportation needs:    Medical: Not on file    Non-medical: Not on file  Tobacco Use  . Smoking status: Never Smoker  . Smokeless tobacco: Never Used  Substance and Sexual Activity  . Alcohol use: Yes    Comment: occ  . Drug use: No  . Sexual activity: Not on file  Lifestyle  . Physical activity:    Days per week: Not on file    Minutes per session: Not on file  . Stress: Not on file  Relationships  . Social connections:    Talks on phone: Not on file    Gets together: Not on file    Attends religious service: Not on file    Active member of club or organization: Not on file    Attends meetings of clubs or organizations: Not on file    Relationship status: Not on file  Other Topics Concern  . Not on file  Social History Narrative  . Not on file   Additional Social History:    Allergies:   Allergies  Allergen Reactions  . Codeine Nausea Only  . Oxycodone Nausea Only    Labs:  Results for orders placed or performed during the hospital encounter of 02/16/18 (from the past 48 hour(s))  Comprehensive metabolic panel     Status: Abnormal   Collection Time: 02/16/18 10:54 PM  Result Value Ref Range   Sodium 142 135 - 145 mmol/L   Potassium 3.3 (L) 3.5 - 5.1 mmol/L   Chloride 108 101 - 111 mmol/L   CO2 25 22 - 32 mmol/L   Glucose, Bld 93 65 - 99 mg/dL   BUN 6 6 - 20 mg/dL   Creatinine, Ser 0.72 0.44 - 1.00 mg/dL   Calcium 9.0 8.9 - 10.3 mg/dL   Total Protein 8.1 6.5 - 8.1 g/dL   Albumin 4.1 3.5 - 5.0 g/dL   AST 19 15 - 41 U/L   ALT 10 (L) 14 - 54 U/L   Alkaline Phosphatase 56 38 - 126 U/L   Total Bilirubin 0.5 0.3 - 1.2 mg/dL   GFR calc non Af Amer >60 >60 mL/min   GFR calc Af Amer >60 >60 mL/min    Comment: (NOTE) The eGFR has been calculated using the CKD EPI equation. This calculation has  not been validated in all clinical situations. eGFR's persistently <60 mL/min signify possible Chronic Kidney Disease.    Anion gap 9 5 - 15    Comment: Performed at [facility], [address] [place], [place] [address]  Ethanol     Status: None   Collection Time: 02/16/18 10:54 PM  Result Value Ref Range   Alcohol, Ethyl (B) <10 <10 mg/dL    Comment: (NOTE) Lowest detectable limit for serum alcohol is 10 mg/dL. For medical purposes only. Performed at [facility], [address] [place], [place] [address]   Salicylate level     Status: None   Collection Time: 02/16/18 10:54 PM  Result Value Ref Range   Salicylate Lvl <  7.0 2.8 - 30.0 mg/dL    Comment: Performed at [facility], [address] [place], [place] [address]  Acetaminophen level     Status: Abnormal   Collection Time: 02/16/18 10:54 PM  Result Value Ref Range   Acetaminophen (Tylenol), Serum <10 (L) 10 - 30 ug/mL    Comment: (NOTE) Therapeutic concentrations vary significantly. A range of 10-30 ug/mL  may be an effective concentration for many patients. However, some  are best treated at concentrations outside of this range. Acetaminophen concentrations >150 ug/mL at 4 hours after ingestion  and >50 ug/mL at 12 hours after ingestion are often associated with  toxic reactions. Performed at [facility], [address] [place], [place] [address]   cbc     Status: None   Collection Time: 02/16/18 10:54 PM  Result Value Ref Range   WBC 9.3 3.6 - 11.0 K/uL   RBC 4.28 3.80 - 5.20 MIL/uL   Hemoglobin 13.4 12.0 - 16.0 g/dL   HCT 39.5 35.0 - 47.0 %   MCV 92.2 80.0 - 100.0 fL   MCH 31.3 26.0 - 34.0 pg   MCHC 34.0 32.0 - 36.0 g/dL   RDW 13.0 11.5 - 14.5 %   Platelets 262 150 - 440 K/uL    Comment: Performed at [facility], [address], [place], [place] [address]  Urine Drug Screen, Qualitative     Status: None   Collection Time: 02/16/18 10:54 PM  Result  Value Ref Range   Tricyclic, Ur Screen NONE DETECTED NONE DETECTED   Amphetamines, Ur Screen NONE DETECTED NONE DETECTED   MDMA (Ecstasy)Ur Screen NONE DETECTED NONE DETECTED   Cocaine Metabolite,Ur [place] NONE DETECTED NONE DETECTED   Opiate, Ur Screen NONE DETECTED NONE DETECTED   Phencyclidine (PCP) Ur S NONE DETECTED NONE DETECTED   Cannabinoid 50 Ng, Ur [place] NONE DETECTED NONE DETECTED   Barbiturates, Ur Screen NONE DETECTED NONE DETECTED   Benzodiazepine, Ur Scrn NONE DETECTED NONE DETECTED   Methadone Scn, Ur NONE DETECTED NONE DETECTED    Comment: (NOTE) Tricyclics + metabolites, urine    Cutoff 1000 ng/mL Amphetamines + metabolites, urine  Cutoff 1000 ng/mL MDMA (Ecstasy), urine              Cutoff 500 ng/mL Cocaine Metabolite, urine          Cutoff 300 ng/mL Opiate + metabolites, urine        Cutoff 300 ng/mL Phencyclidine (PCP), urine         Cutoff 25 ng/mL Cannabinoid, urine                 Cutoff 50 ng/mL Barbiturates + metabolites, urine  Cutoff 200 ng/mL Benzodiazepine, urine              Cutoff 200 ng/mL Methadone, urine                   Cutoff 300 ng/mL The urine drug screen provides only a preliminary, unconfirmed analytical test result and should not be used for non-medical purposes. Clinical consideration and professional judgment should be applied to any positive drug screen result due to possible interfering substances. A more specific alternate chemical method must be used in order to obtain a confirmed analytical result. Gas chromatography / mass spectrometry (GC/MS) is the preferred confirmat ory method. Performed at [facility], [address] [place], [place] [address]   Pregnancy, urine POC     Status: None   Collection  Time: 02/16/18 11:36 PM  Result Value Ref Range   Preg Test, Ur NEGATIVE NEGATIVE    Comment:        THE SENSITIVITY OF THIS METHODOLOGY IS >24 mIU/mL     No current facility-administered medications for this encounter.     No current outpatient medications on file.    Musculoskeletal: Strength & Muscle Tone: within normal limits Gait & Station: normal Patient leans: N/A  Psychiatric Specialty Exam: Physical Exam  Nursing note and vitals reviewed. Constitutional: She appears well-developed and well-nourished.  HENT:  Head: Normocephalic and atraumatic.  Eyes: Pupils are equal, round, and reactive to light. Conjunctivae are normal.  Neck: Normal range of motion.  Cardiovascular: Regular rhythm and normal heart sounds.  Respiratory: Effort normal. No respiratory distress.  GI: Soft.  Musculoskeletal: Normal range of motion.  Neurological: She is alert.  Skin: Skin is warm and dry.  Psychiatric: She has a normal mood and affect. Her behavior is normal. Judgment and thought content normal.    Review of Systems  Constitutional: Negative.   HENT: Negative.   Eyes: Negative.   Respiratory: Negative.   Cardiovascular: Negative.   Gastrointestinal: Negative.   Musculoskeletal: Negative.   Skin: Negative.   Neurological: Negative.   Psychiatric/Behavioral: Negative.     Blood pressure 138/76, pulse 85, temperature 98.9 F (37.2 C), resp. rate 19, height '5\' 5"'$  (1.651 m), weight 45.4 kg (100 lb), SpO2 99 %.Body mass index is 16.64 kg/m.  General Appearance: Casual  Eye Contact:  Good  Speech:  Clear and Coherent  Volume:  Normal  Mood:  Dysphoric  Affect:  Constricted  Thought Process:  Goal Directed  Orientation:  Full (Time, Place, and Person)  Thought Content:  Logical  Suicidal Thoughts:  No  Homicidal Thoughts:  No  Memory:  Immediate;   Good Recent;   Good Remote;   Good  Judgement:  Good  Insight:  Fair  Psychomotor Activity:  Decreased  Concentration:  Concentration: Fair  Recall:  [employer] of Knowledge:  Fair  Language:  Fair  Akathisia:  No  Handed:  Right  AIMS (if indicated):     Assets:  Desire for Improvement Housing Physical Health Resilience Social Support   ADL's:  Intact  Cognition:  WNL  Sleep:        Treatment Plan Summary: Daily contact with patient to assess and evaluate symptoms and progress in treatment, Medication management and Plan 32 year old woman with a history of no past psychiatric problems comes into the hospital after very superficially cutting herself.  Totally denies suicidal ideation.  Calm appropriate behavior now.  No evidence psychosis.  Counseling completed with the patient and encouraged her to consider seeking psychotherapy if mood problems persist.  Discontinue IVC.  Patient can be discharged from the emergency room at the discretion of the ER physician.  Case reviewed with ER doctor and TTS.  Disposition: No evidence of imminent risk to self or others at present.   Patient does not meet criteria for psychiatric inpatient admission. Supportive therapy provided about ongoing stressors. Discussed crisis plan, support from social network, calling 911, coming to the Emergency Department, and calling Suicide Hotline.  Alethia Berthold, MD 02/17/2018 8:01 PM

## 2018-02-17 NOTE — ED Notes (Signed)
Patient lying in hallway bed eyes open, NAD observed and will continue to monitor 

## 2018-04-27 ENCOUNTER — Emergency Department
Admission: EM | Admit: 2018-04-27 | Discharge: 2018-04-27 | Disposition: A | Discharge status: Home / Self Care | Payer: Self-pay | Attending: Emergency Medicine | Admitting: Emergency Medicine

## 2018-04-27 ENCOUNTER — Encounter: Payer: Self-pay | Admitting: *Deleted

## 2018-04-27 ENCOUNTER — Other Ambulatory Visit: Payer: Self-pay

## 2018-04-27 DIAGNOSIS — Y929 Unspecified place or not applicable: Secondary | ICD-10-CM | POA: Insufficient documentation

## 2018-04-27 DIAGNOSIS — Y999 Unspecified external cause status: Secondary | ICD-10-CM | POA: Insufficient documentation

## 2018-04-27 DIAGNOSIS — T148XXA Other injury of unspecified body region, initial encounter: Secondary | ICD-10-CM

## 2018-04-27 DIAGNOSIS — X509XXA Other and unspecified overexertion or strenuous movements or postures, initial encounter: Secondary | ICD-10-CM | POA: Insufficient documentation

## 2018-04-27 DIAGNOSIS — Y939 Activity, unspecified: Secondary | ICD-10-CM | POA: Insufficient documentation

## 2018-04-27 DIAGNOSIS — S29012A Strain of muscle and tendon of back wall of thorax, initial encounter: Secondary | ICD-10-CM | POA: Insufficient documentation

## 2018-04-27 HISTORY — DX: Unspecified appendicitis: K37

## 2018-04-27 MED ORDER — CYCLOBENZAPRINE HCL 5 MG PO TABS: ORAL_TABLET | ORAL | 0 refills | Status: DC

## 2018-04-27 MED ORDER — LIDOCAINE 5 % EX PTCH
1.0000 | MEDICATED_PATCH | CUTANEOUS | Status: DC
  Administered 2018-04-27: 1 via TRANSDERMAL
  Filled 2018-04-27: qty 1

## 2018-04-27 MED ORDER — KETOROLAC TROMETHAMINE 30 MG/ML IJ SOLN
30.0000 mg | Freq: Once | INTRAMUSCULAR | Status: AC
  Administered 2018-04-27: 30 mg via INTRAMUSCULAR
  Filled 2018-04-27: qty 1

## 2018-04-27 MED ORDER — KETOROLAC TROMETHAMINE 10 MG PO TABS: 10.0000 mg | ORAL_TABLET | Freq: Four times a day (QID) | ORAL | 0 refills | Status: DC | PRN

## 2018-04-27 MED ORDER — ORPHENADRINE CITRATE 30 MG/ML IJ SOLN
60.0000 mg | Freq: Two times a day (BID) | INTRAMUSCULAR | Status: DC
  Administered 2018-04-27: 60 mg via INTRAMUSCULAR
  Filled 2018-04-27: qty 2

## 2018-04-27 MED ORDER — LIDOCAINE 5 % EX PTCH: 1.0000 | MEDICATED_PATCH | CUTANEOUS | 0 refills | Status: DC

## 2018-04-27 NOTE — ED Provider Notes (Signed)
[facility] Emergency Department Provider Note  ____________________________________________  Time seen: Approximately 2:52 PM  I have reviewed the triage vital signs and the nursing notes.   HISTORY  Chief Complaint Neck Injury    HPI Candace GoingMercedes Rivera is a 32 y.o. female that presents to the emergency department for evaluation of left neck pain for 1 day.  Pain is primarily between her neck and her left shoulder.  Pain does not radiate.  Patient states that pain is worse when she moves her neck to the left and when she lifts her left shoulder.  No trauma.  No heavy lifting.  No alleviating measures have been attempted.  No fever, chills, headache, shortness of breath, chest pain.  Past Medical History:  Diagnosis Date  . Appendicitis     Patient Active Problem List   Diagnosis Date Noted  . 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 Self-inflicted laceration of wrist 02/17/2018    Past Surgical History:  Procedure Laterality Date  . ABSCESS DRAINAGE    . APPENDECTOMY      Prior to Admission medications   Medication Sig Start Date End Date Taking? Authorizing Provider  cyclobenzaprine (FLEXERIL) 5 MG tablet Take 1-2 tablets 3 times daily as needed 04/27/18   [place] DerryWagner, Deshaun Schou, PA-C  ketorolac (TORADOL) 10 MG tablet Take 1 tablet (10 mg total) by mouth every 6 (six) hours as needed. 04/27/18   En[place] DerryWagner, Subrina Vecchiarelli, PA-C  lidocaine (LIDODERM) 5 % Place 1 patch onto the skin daily. Remove & Discard patch within 12 hours or as directed by MD 04/27/18   [place] DerryWagner, Nastasha Reising, PA-C    Allergies Codeine and Oxycodone  History reviewed. No pertinent family history.  Social History Social History   Tobacco Use  . Smoking status: Never Smoker  . Smokeless tobacco: Never Used  Substance Use Topics  . Alcohol use: Yes    Comment: occ  . Drug use: No     Review of Systems  Constitutional: No fever/chills Cardiovascular: No  chest pain. Respiratory: No SOB. Gastrointestinal: No nausea, no vomiting.  Musculoskeletal: Positive for neck and shoulder pain.  Skin: Negative for rash, abrasions, lacerations, ecchymosis. Neurological: Negative for headaches, numbness or tingling   ____________________________________________   PHYSICAL EXAM:  VITAL SIGNS: ED Triage Vitals  Enc Vitals Group     BP 04/27/18 1333 (!) 130/94     Pulse Rate 04/27/18 1333 92     Resp 04/27/18 1333 16     Temp 04/27/18 1333 98.4 F (36.9 C)     Temp Source 04/27/18 1333 Oral     SpO2 04/27/18 1333 99 %     Weight 04/27/18 1334 100 lb (45.4 kg)     Height 04/27/18 1334 5\' 6"  (1.676 m)     Head Circumference --      Peak Flow --      Pain Score 04/27/18 1334 8     Pain Loc --      Pain Edu? --      Excl. in GC? --      Constitutional: Alert and oriented. Well appearing and in no acute distress. Eyes: Conjunctivae are normal. PERRL. EOMI. Head: Atraumatic. ENT:      Ears:      Nose: No congestion/rhinnorhea.      Mouth/Throat: Mucous membranes are moist.  Neck: No stridor. No cervical spine tenderness to palpation.  Tenderness to palpation over left trapezius muscle.  Pain elicited with left range of motion  of neck and with abduction of left shoulder.  Cardiovascular: Normal rate, regular rhythm.  Good peripheral circulation. Respiratory: Normal respiratory effort without tachypnea or retractions. Lungs CTAB. Good air entry to the bases with no decreased or absent breath sounds. Musculoskeletal: Full range of motion to all extremities. No gross deformities appreciated. Strength and sensation equal in upper extremities bilaterally. Neurologic:  Normal speech and language. No gross focal neurologic deficits are appreciated.  Skin:  Skin is warm, dry and intact. No rash noted. Psychiatric: Mood and affect are normal. Speech and behavior are normal. Patient exhibits appropriate insight and  judgement.   ____________________________________________   LABS (all labs ordered are listed, but only abnormal results are displayed)  Labs Reviewed - No data to display ____________________________________________  EKG   ____________________________________________  RADIOLOGY   No results found.  ____________________________________________    PROCEDURES  Procedure(s) performed:    Procedures    Medications  orphenadrine (NORFLEX) injection 60 mg (60 mg Intramuscular Given 04/27/18 1505)  lidocaine (LIDODERM) 5 % 1 patch (1 patch Transdermal Patch Applied 04/27/18 1505)  ketorolac (TORADOL) 30 MG/ML injection 30 mg (30 mg Intramuscular Given 04/27/18 1505)     ____________________________________________   INITIAL IMPRESSION / ASSESSMENT AND PLAN / ED COURSE  Pertinent labs & imaging results that were available during my care of the patient were reviewed by me and considered in my medical decision making (see chart for details).  Review of the [place] CSRS was performed in accordance of the [facility] prior to dispensing any controlled drugs.    Patient's diagnosis is consistent with trapezius muscle strain. Vital signs and exam are reassuring.  Patient was given IM Toradol and Norflex in ED.  Patient will be discharged home with prescriptions for Toradol and Flexeril. Patient is to follow up with PCP as directed. Patient is given ED precautions to return to the ED for any worsening or new symptoms.     ____________________________________________  FINAL CLINICAL IMPRESSION(S) / ED DIAGNOSES  Final diagnoses:  Muscle strain      NEW MEDICATIONS STARTED DURING THIS VISIT:  ED Discharge Orders         Ordered    ketorolac (TORADOL) 10 MG tablet  Every 6 hours PRN     04/27/18 1512    cyclobenzaprine (FLEXERIL) 5 MG tablet     04/27/18 1512    lidocaine (LIDODERM) 5 %  Every 24 hours     04/27/18 1512              This chart was dictated using  voice recognition software/Dragon. Despite best efforts to proofread, errors can occur which can change the meaning. Any change was purely unintentional.    [place] DerryWagner, Lesle Faron, PA-C 04/27/18 1702    Jene EveryKinner, Robert, MD 04/28/18 1407

## 2018-04-27 NOTE — ED Triage Notes (Signed)
PT to ED reporting left sided neck pain that increases when moving head from side to side or moving left arm. Pt does not remember injuring self today but does lift some "normal sized objects" daily.

## 2018-10-08 ENCOUNTER — Encounter: Payer: Self-pay | Admitting: Emergency Medicine

## 2018-10-08 ENCOUNTER — Emergency Department
Admission: EM | Admit: 2018-10-08 | Discharge: 2018-10-08 | Disposition: A | Discharge status: Home / Self Care | Payer: Self-pay | Attending: Emergency Medicine | Admitting: Emergency Medicine

## 2018-10-08 ENCOUNTER — Other Ambulatory Visit: Payer: Self-pay

## 2018-10-08 DIAGNOSIS — J101 Influenza due to other identified influenza virus with other respiratory manifestations: Secondary | ICD-10-CM | POA: Insufficient documentation

## 2018-10-08 LAB — INFLUENZA PANEL BY PCR (TYPE A & B)
INFLAPCR: POSITIVE — AB
Influenza B By PCR: NEGATIVE

## 2018-10-08 MED ORDER — BENZONATATE 100 MG PO CAPS: ORAL_CAPSULE | ORAL | 0 refills | Status: DC

## 2018-10-08 MED ORDER — OSELTAMIVIR PHOSPHATE 75 MG PO CAPS: 75.0000 mg | ORAL_CAPSULE | Freq: Two times a day (BID) | ORAL | 0 refills | Status: AC

## 2018-10-08 NOTE — ED Triage Notes (Signed)
Patient ambulatory to triage with steady gait, without difficulty or distress noted; pt st x 3 days nonprod cough accomp by body aches and chills and fever

## 2018-10-08 NOTE — Discharge Instructions (Signed)
You have tested positive for the flu. Take the Tamiflu as directed. Take OTC Delsym for cough, allergy medicine for runny nose, Flonase for nasal congestion, and pseudoephedrine for sinus pressure. Rest and hydrate to prevent dehydration. Follow-up with [facility] or return as needed.

## 2018-10-09 NOTE — ED Provider Notes (Signed)
[facility] Emergency Department Provider Note ____________________________________________  Time seen: 2059  I have reviewed the triage vital signs and the nursing notes.  HISTORY  Chief Complaint  Cough  HPI Candace Rivera is a 33 y.o. female presents himself to the ED for evaluation of 2 days of nonproductive cough and body aches as well as chills and fevers.  Patient did not receive the seasonal flu vaccine.  She left work early today for evaluation after symptoms worsen sharply.  Nuys any nausea, vomiting, or diarrhea.  Past Medical History:  Diagnosis Date  . Appendicitis     Patient Active Problem List   Diagnosis Date Noted  . 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 Self-inflicted laceration of wrist 02/17/2018    Past Surgical History:  Procedure Laterality Date  . ABSCESS DRAINAGE    . APPENDECTOMY      Prior to Admission medications   Medication Sig Start Date End Date Taking? Authorizing Provider  benzonatate (TESSALON PERLES) 100 MG capsule Take 1-2 tabs TID prn cough 10/08/18   Jed Kutch, Charlesetta IvoryJenise V Bacon, PA-C  oseltamivir (TAMIFLU) 75 MG capsule Take 1 capsule (75 mg total) by mouth 2 (two) times daily for 5 days. 10/08/18 10/13/18  Lowella Kindley, Charlesetta IvoryJenise V Bacon, PA-C    Allergies Codeine and Oxycodone  No family history on file.  Social History Social History   Tobacco Use  . Smoking status: Never Smoker  . Smokeless tobacco: Never Used  Substance Use Topics  . Alcohol use: Yes    Comment: occ  . Drug use: No    Review of Systems  Constitutional: Positive for fevers and chills. Eyes: Negative for visual changes. ENT: Negative for sore throat. Cardiovascular: Negative for chest pain. Respiratory: Negative for shortness of breath.  Reports nonproductive cough. Gastrointestinal: Negative for abdominal pain, vomiting and diarrhea. Genitourinary: Negative for dysuria. Musculoskeletal:  Negative for back pain. Reports generalized body aches. Skin: Negative for rash. Neurological: Negative for headaches, focal weakness or numbness. ____________________________________________  PHYSICAL EXAM:  VITAL SIGNS: ED Triage Vitals  Enc Vitals Group     BP 10/08/18 2008 (!) 136/101     Pulse Rate 10/08/18 2008 94     Resp 10/08/18 2008 19     Temp 10/08/18 2008 98.6 F (37 C)     Temp Source 10/08/18 2008 Oral     SpO2 10/08/18 2008 100 %     Weight 10/08/18 2003 100 lb (45.4 kg)     Height 10/08/18 2003 5\' 5"  (1.651 m)     Head Circumference --      Peak Flow --      Pain Score 10/08/18 2003 9     Pain Loc --      Pain Edu? --      Excl. in GC? --     Constitutional: Alert and oriented. Well appearing and in no distress. Head: Normocephalic and atraumatic. Eyes: Conjunctivae are normal. PERRL. Normal extraocular movements Ears: Canals clear. TMs intact bilaterally. Nose: No congestion/rhinorrhea/epistaxis. Mouth/Throat: Mucous membranes are moist. Neck: Supple. No thyromegaly. Hematological/Lymphatic/Immunological: No cervical lymphadenopathy. Cardiovascular: Normal rate, regular rhythm. Normal distal pulses. Respiratory: Normal respiratory effort. No wheezes/rales/rhonchi. Musculoskeletal: Nontender with normal range of motion in all extremities.  Neurologic:  Normal gait without ataxia. Normal speech and language. No gross focal neurologic deficits are appreciated. ____________________________________________   LABS (pertinent positives/negatives) Labs Reviewed  INFLUENZA PANEL BY PCR (TYPE A & B) - Abnormal; Notable for the following  components:      Result Value   Influenza A By PCR POSITIVE (*)    All other components within normal limits  ____________________________________________  PROCEDURES  Procedures ____________________________________________  INITIAL IMPRESSION / ASSESSMENT AND PLAN / ED COURSE  Patient with ED evaluation of 2-day  complaint of sudden onset of body aches and fever.  Patient was found to be positive for influenza A on PCR screening today.  Patient is inclined to take Tamiflu as such, prescription for that along with salon Perles are provided.  She will take over-the-counter medicines for additional symptom relief.  A work note is provided for the remainder of the week. ____________________________________________  FINAL CLINICAL IMPRESSION(S) / ED DIAGNOSES  Final diagnoses:  Influenza A      Karmen StabsMenshew, Charlesetta IvoryJenise V Bacon, PA-C 10/09/18 0025    Myrna BlazerSchaevitz, David Matthew, MD 10/09/18 [phone]

## 2019-05-06 ENCOUNTER — Encounter (HOSPITAL_COMMUNITY): Payer: Self-pay | Admitting: Emergency Medicine

## 2019-05-06 ENCOUNTER — Emergency Department (HOSPITAL_COMMUNITY)
Admission: EM | Admit: 2019-05-06 | Discharge: 2019-05-06 | Disposition: A | Discharge status: Home / Self Care | Payer: Medicaid Other | Attending: Emergency Medicine | Admitting: Emergency Medicine

## 2019-05-06 ENCOUNTER — Other Ambulatory Visit: Payer: Self-pay

## 2019-05-06 DIAGNOSIS — M25531 Pain in right wrist: Secondary | ICD-10-CM | POA: Diagnosis present

## 2019-05-06 DIAGNOSIS — R200 Anesthesia of skin: Secondary | ICD-10-CM | POA: Insufficient documentation

## 2019-05-06 MED ORDER — TRAMADOL HCL 50 MG PO TABS: 50.0000 mg | ORAL_TABLET | Freq: Four times a day (QID) | ORAL | 0 refills | Status: DC | PRN

## 2019-05-06 NOTE — Discharge Instructions (Signed)
Please read attached information. If you experience any new or worsening signs or symptoms please return to the emergency room for evaluation. Please follow-up with your primary care provider or specialist as discussed. Please use medication prescribed only as directed and discontinue taking if you have any concerning signs or symptoms.   °

## 2019-05-06 NOTE — Care Management (Signed)
ED CM received a call from [place] at the pharmacy requesting the patient's diagnosis. Informed the patient is here with wrist pain.

## 2019-05-06 NOTE — ED Provider Notes (Signed)
[facility] EMERGENCY DEPARTMENT Provider Note   CSN: [id number] Arrival date & time: 05/06/19  1451     History   Chief Complaint Chief Complaint  Patient presents with  . Wrist Pain  . Numbness    HPI Candace Rivera is a 33 y.o. female.     HPI   33 year old female presents today with complaints of pain in numbness in her right hand.  She notes a significant past medical history of carpal tunnel syndrome.  She reports that she has had injection in her wrist for this previously.  She notes over the last 3 days she has had worsening pain in the right proximal volar wrist.  She notes numbness into the fourth and fifth fingers.  She notes that she has not been using her splint, has been lifting more heavy boxes at work.  She denies any other pain to the remainder of the extremity.      Past Medical History:  Diagnosis Date  . Appendicitis     Patient Active Problem List   Diagnosis Date Noted  . 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 Self-inflicted laceration of wrist 02/17/2018    Past Surgical History:  Procedure Laterality Date  . ABSCESS DRAINAGE    . APPENDECTOMY       OB History   No obstetric history on file.      Home Medications    Prior to Admission medications   Medication Sig Start Date End Date Taking? Authorizing Provider  benzonatate (TESSALON PERLES) 100 MG capsule Take 1-2 tabs TID prn cough 10/08/18   Menshew, Dannielle Karvonen, PA-C  traMADol (ULTRAM) 50 MG tablet Take 1 tablet (50 mg total) by mouth every 6 (six) hours as needed. 05/06/19   Okey Regal, PA-C    Family History History reviewed. No pertinent family history.  Social History Social History   Tobacco Use  . Smoking status: Never Smoker  . Smokeless tobacco: Never Used  Substance Use Topics  . Alcohol use: Not Currently    Comment: occ  . Drug use: No     Allergies   Codeine and Oxycodone   Review  of Systems Review of Systems  All other systems reviewed and are negative.    Physical Exam Updated Vital Signs BP 113/74 (BP Location: Right Arm)   Pulse 92   Temp 98.4 F (36.9 C) (Oral)   Resp 16   Ht 5\' 4"  (1.626 m)   Wt 45.4 kg   LMP 04/27/2019   SpO2 98%   BMI 17.16 kg/m   Physical Exam Vitals signs and nursing note reviewed.  Constitutional:      Appearance: She is well-developed.  HENT:     Head: Normocephalic and atraumatic.  Eyes:     General: No scleral icterus.       Right eye: No discharge.        Left eye: No discharge.     Conjunctiva/sclera: Conjunctivae normal.     Pupils: Pupils are equal, round, and reactive to light.  Neck:     Musculoskeletal: Normal range of motion.     Vascular: No JVD.     Trachea: No tracheal deviation.  Pulmonary:     Effort: Pulmonary effort is normal.     Breath sounds: No stridor.  Musculoskeletal:     Comments: Right hand is atraumatic without swelling or edema, normal color cap refill intact to the fingers, strength 5 out of 5  range of motion intact.  Tenderness to palpation over the volar wrist full active range of motion at the wrist, joint compartments are soft-radial pulse 2+  Neurological:     Mental Status: She is alert and oriented to person, place, and time.     Coordination: Coordination normal.  Psychiatric:        Behavior: Behavior normal.        Thought Content: Thought content normal.        Judgment: Judgment normal.      ED Treatments / Results  Labs (all labs ordered are listed, but only abnormal results are displayed) Labs Reviewed - No data to display  EKG None  Radiology No results found.  Procedures Procedures (including critical care time)  Medications Ordered in ED Medications - No data to display   Initial Impression / Assessment and Plan / ED Course  I have reviewed the triage vital signs and the nursing notes.  Pertinent labs & imaging results that were available during  my care of the patient were reviewed by me and considered in my medical decision making (see chart for details).       33 year old female presents today with complaints of wrist pain.  Her distribution is not consistent with carpal tunnel although the rest of her exam is.  Patient will be treated with Ultram, rest ice and immobilization with wrist splint.  She is given strict return precautions and encouraged follow-up as an outpatient if symptoms persist.  She verbalized understanding and agreement to today's plan had no further questions or concerns.  Final Clinical Impressions(s) / ED Diagnoses   Final diagnoses:  Right wrist pain    ED Discharge Orders         Ordered    traMADol (ULTRAM) 50 MG tablet  Every 6 hours PRN     05/06/19 1720           Eyvonne MechanicHedges, Lavona Norsworthy, PA-C 05/06/19 1720    Vanetta MuldersZackowski, Scott, MD 05/15/19 1205

## 2019-05-06 NOTE — ED Triage Notes (Signed)
Pt reports right wrist pain and numbness for the last 3 days. States has tried tylenol and motrin with little relief. Pt has a brace she uses and has had shots for pain relief prior.

## 2019-06-06 ENCOUNTER — Encounter (HOSPITAL_COMMUNITY): Payer: Self-pay | Admitting: Emergency Medicine

## 2019-06-06 ENCOUNTER — Emergency Department (HOSPITAL_COMMUNITY)
Admission: EM | Admit: 2019-06-06 | Discharge: 2019-06-06 | Disposition: A | Discharge status: Home / Self Care | Payer: Medicaid Other | Attending: Emergency Medicine | Admitting: Emergency Medicine

## 2019-06-06 ENCOUNTER — Inpatient Hospital Stay (EMERGENCY_DEPARTMENT_HOSPITAL)
Admission: EM | Admit: 2019-06-06 | Discharge: 2019-06-06 | Disposition: A | Discharge status: Home / Self Care | Payer: Medicaid Other | Source: Home / Self Care | Admitting: Obstetrics and Gynecology

## 2019-06-06 ENCOUNTER — Encounter (HOSPITAL_COMMUNITY): Payer: Self-pay

## 2019-06-06 ENCOUNTER — Other Ambulatory Visit: Payer: Self-pay

## 2019-06-06 DIAGNOSIS — Z5321 Procedure and treatment not carried out due to patient leaving prior to being seen by health care provider: Secondary | ICD-10-CM | POA: Insufficient documentation

## 2019-06-06 DIAGNOSIS — K5900 Constipation, unspecified: Secondary | ICD-10-CM | POA: Insufficient documentation

## 2019-06-06 DIAGNOSIS — R112 Nausea with vomiting, unspecified: Secondary | ICD-10-CM | POA: Diagnosis present

## 2019-06-06 DIAGNOSIS — O26893 Other specified pregnancy related conditions, third trimester: Secondary | ICD-10-CM | POA: Insufficient documentation

## 2019-06-06 DIAGNOSIS — O26899 Other specified pregnancy related conditions, unspecified trimester: Secondary | ICD-10-CM

## 2019-06-06 DIAGNOSIS — R110 Nausea: Secondary | ICD-10-CM | POA: Insufficient documentation

## 2019-06-06 DIAGNOSIS — O26891 Other specified pregnancy related conditions, first trimester: Secondary | ICD-10-CM

## 2019-06-06 DIAGNOSIS — O99611 Diseases of the digestive system complicating pregnancy, first trimester: Secondary | ICD-10-CM | POA: Insufficient documentation

## 2019-06-06 DIAGNOSIS — Z3A01 Less than 8 weeks gestation of pregnancy: Secondary | ICD-10-CM

## 2019-06-06 LAB — URINALYSIS, ROUTINE W REFLEX MICROSCOPIC
Bilirubin Urine: NEGATIVE
Bilirubin Urine: NEGATIVE
Glucose, UA: NEGATIVE mg/dL
Glucose, UA: NEGATIVE mg/dL
Hgb urine dipstick: NEGATIVE
Hgb urine dipstick: NEGATIVE
Ketones, ur: 20 mg/dL — AB
Ketones, ur: NEGATIVE mg/dL
Leukocytes,Ua: NEGATIVE
Nitrite: NEGATIVE
Nitrite: NEGATIVE
Protein, ur: NEGATIVE mg/dL
Protein, ur: NEGATIVE mg/dL
Specific Gravity, Urine: 1.029 (ref 1.005–1.030)
Specific Gravity, Urine: 1.018 (ref 1.005–1.030)
pH: 6 (ref 5.0–8.0)
pH: 7 (ref 5.0–8.0)

## 2019-06-06 LAB — CBC
HCT: 33.5 % — ABNORMAL LOW (ref 36.0–46.0)
Hemoglobin: 11.5 g/dL — ABNORMAL LOW (ref 12.0–15.0)
MCH: 31.6 pg (ref 26.0–34.0)
MCHC: 34.3 g/dL (ref 30.0–36.0)
MCV: 92 fL (ref 80.0–100.0)
Platelets: 280 10*3/uL (ref 150–400)
RBC: 3.64 MIL/uL — ABNORMAL LOW (ref 3.87–5.11)
RDW: 12.3 % (ref 11.5–15.5)
WBC: 10.5 10*3/uL (ref 4.0–10.5)
nRBC: 0 % (ref 0.0–0.2)

## 2019-06-06 LAB — COMPREHENSIVE METABOLIC PANEL
ALT: 11 U/L (ref 0–44)
AST: 17 U/L (ref 15–41)
Albumin: 3.6 g/dL (ref 3.5–5.0)
Alkaline Phosphatase: 44 U/L (ref 38–126)
Anion gap: 7 (ref 5–15)
BUN: 10 mg/dL (ref 6–20)
CO2: 23 mmol/L (ref 22–32)
Calcium: 8.6 mg/dL — ABNORMAL LOW (ref 8.9–10.3)
Chloride: 102 mmol/L (ref 98–111)
Creatinine, Ser: 0.63 mg/dL (ref 0.44–1.00)
GFR calc Af Amer: >60 mL/min (ref >60)
GFR calc non Af Amer: >60 mL/min (ref >60)
Glucose, Bld: 80 mg/dL (ref 70–99)
Potassium: 3.6 mmol/L (ref 3.5–5.1)
Sodium: 132 mmol/L — ABNORMAL LOW (ref 135–145)
Total Bilirubin: 0.4 mg/dL (ref 0.3–1.2)
Total Protein: 6.7 g/dL (ref 6.5–8.1)

## 2019-06-06 LAB — I-STAT BETA HCG BLOOD, ED (MC, WL, AP ONLY): I-stat hCG, quantitative: >2000 m[IU]/mL — ABNORMAL HIGH (ref <5)

## 2019-06-06 LAB — LIPASE, BLOOD: Lipase: 36 U/L (ref 11–51)

## 2019-06-06 MED ORDER — METOCLOPRAMIDE HCL 10 MG PO TABS: 10.0000 mg | ORAL_TABLET | Freq: Three times a day (TID) | ORAL | 0 refills | Status: DC | PRN

## 2019-06-06 MED ORDER — SODIUM CHLORIDE 0.9% FLUSH: 3.0000 mL | Freq: Once | INTRAVENOUS | Status: DC

## 2019-06-06 MED ORDER — METOCLOPRAMIDE HCL 10 MG PO TABS
10.0000 mg | ORAL_TABLET | Freq: Once | ORAL | Status: AC
  Administered 2019-06-06: 15:00:00 10 mg via ORAL
  Filled 2019-06-06: qty 1

## 2019-06-06 NOTE — Discharge Instructions (Signed)
You have constipation which is hard stools that are difficult to pass. It is important to have regular bowel movements every 1-3 days that are soft and easy to pass. Hard stools increase your risk of hemorrhoids and are very uncomfortable.   To prevent constipation you can increase the amount of fiber in your diet. Examples of foods with fiber are leafy greens, whole grain breads, oatmeal and other grains.  It is also important to drink at least eight 8oz glass of water everyday.   If you have not has a bowel movement in 4-5 days you made need to clean out your bowel.  This will have establish normal movement through your bowel.    Miralax Clean out  Take 8 capfuls of miralax in 64 oz of gatorade. You can use any fluid that appeals to you (gatorade, water, juice)  Continue to drink at least eight 8 oz glasses of water throughout the day  You can repeat with another 8 capfuls of miralax in 64 oz of gatorade if you are not having a large amount of stools  You will need to be at home and close to a bathroom for about 8 hours when you do the above as you may need to go to the bathroom frequently.   After you are cleaned out: - Start Colace100mg  twice daily - Start Miralax once daily - Start a daily fiber supplement like metamucil or citrucel - You can safely use enemas in pregnancy  - if you are having diarrhea you can reduce to Colace once a day or miralax every other day or a 1/2 capful daily.     [place] [facility] [place] [facility]       Phone: [phone]  [facility] [place] [facility] [place] Phone: [place] [facility] [place] [facility] [place]  Phone: [place] [facility] [place] at [facility]  Phone: [place] [facility] [place] at [place]  Phone: [facility]       Phone: [phone]  [facility]    Phone:  [phone]   [facility] [place])    Phone: [facility]    Phone: [phone]  [facility]    Phone: [place] [facility]  Phone: [place]    Phone: [phone]  [facility]   Phone: [phone]  [facility]    Phone: [phone]     Morning Sickness  Morning sickness is when a woman feels nauseous during pregnancy. This nauseous feeling may or may not come with vomiting. It often occurs in the morning, but it can be a problem at any time of day. Morning sickness is most common during the first trimester. In some cases, it may continue throughout pregnancy. Although morning sickness is unpleasant, it is usually harmless unless the woman develops severe and continual vomiting (hyperemesis gravidarum), a condition that requires more intense treatment. What are the causes? The exact cause of this condition is not known, but it seems to be related to normal hormonal changes that occur in pregnancy. What increases the risk? You are more likely to develop this condition if:  You experienced nausea or vomiting before your pregnancy.  You had morning sickness during a previous pregnancy.  You are pregnant with more than one baby, such as twins. What are the signs  or symptoms? Symptoms of this condition include:  Nausea.  Vomiting. How is this diagnosed? This condition is usually diagnosed based on your signs and symptoms. How is this treated? In many cases, treatment is not needed for this condition. Making some changes to what you eat may help to control symptoms. Your health care provider may also prescribe or recommend:  Vitamin B6 supplements.  Anti-nausea medicines.  Ginger. Follow these instructions at home: Medicines  Take over-the-counter and prescription medicines only as told  by your health care provider. Do not use any prescription, over-the-counter, or herbal medicines for morning sickness without first talking with your health care provider.  Taking multivitamins before getting pregnant can prevent or decrease the severity of morning sickness in most women. Eating and drinking  Eat a piece of dry toast or crackers before getting out of bed in the morning.  Eat 5 or 6 small meals a day.  Eat dry and bland foods, such as rice or a baked potato. Foods that are high in carbohydrates are often helpful.  Avoid greasy, fatty, and spicy foods.  Have someone cook for you if the smell of any food causes nausea and vomiting.  If you feel nauseous after taking prenatal vitamins, take the vitamins at night or with a snack.  Snack on protein foods between meals if you are hungry. Nuts, yogurt, and cheese are good options.  Drink fluids throughout the day.  Try ginger ale made with real ginger, ginger tea made from fresh grated ginger, or ginger candies. General instructions  Do not use any products that contain nicotine or tobacco, such as cigarettes and e-cigarettes. If you need help quitting, ask your health care provider.  Get an air purifier to keep the air in your house free of odors.  Get plenty of fresh air.  Try to avoid odors that trigger your nausea.  Consider trying these methods to help relieve symptoms: ? Wearing an acupressure wristband. These wristbands are often worn for seasickness. ? Acupuncture. Contact a health care provider if:  Your home remedies are not working and you need medicine.  You feel dizzy or light-headed.  You are losing weight. Get help right away if:  You have persistent and uncontrolled nausea and vomiting.  You faint.  You have severe pain in your abdomen. Summary  Morning sickness is when a woman feels nauseous during pregnancy. This nauseous feeling may or may not come with vomiting.  Morning sickness is  most common during the first trimester.  It often occurs in the morning, but it can be a problem at any time of day.  In many cases, treatment is not needed for this condition. Making some changes to what you eat may help to control symptoms. This information is not intended to replace advice given to you by your health care provider. Make sure you discuss any questions you have with your health care provider. Document Released: 10/24/2006 Document Revised: 08/15/2017 Document Reviewed: 10/05/2016 Elsevier Patient Education  2020 [employer]

## 2019-06-06 NOTE — MAU Note (Signed)
Nancylee Gaines is a 33 y.o. at [redacted]w[redacted]d here in MAU reporting: decreased appetite and nausea for the past 4 days. States she is also constipated and has not had a bowel movement in a week. No vomiting. Denies pain and bleeding currently. Has some spotting intermittently and the last time was 2 days ago.  Onset of complaint: ongoing  Pain score: 0/10  Vitals:   06/06/19 1214  BP: 117/75  Pulse: 89  Resp: 18  Temp: 98.2 F (36.8 C)  SpO2: 100%      Lab orders placed from triage: UA

## 2019-06-06 NOTE — ED Triage Notes (Signed)
Pt c/o nausea/vomiting. Denies abdominal pain/diarrhea.

## 2019-06-06 NOTE — MAU Provider Note (Signed)
Chief Complaint: Nausea and Constipation   First Provider Initiated Contact with Patient 06/06/19 1408     SUBJECTIVE HPI: Candace Rivera is a 33 y.o. G1P0 at [redacted]w[redacted]d who presents to Maternity Admissions reporting nausea. Symptoms started last week. Reports constant nausea that is worse with certain smells. Has not vomited. Does not have antiemetic at home. Does not know where she plans on going for prenatal care.  Also reports constipation. Last BM was over a week ago. Normally has a BM every other day. Has not treated symptoms.   Denies abdominal pain, fever/chills, vaginal bleeding, vomiting, or diarrhea.    Past Medical History:  Diagnosis Date  . Appendicitis    OB History  Gravida Para Term Preterm AB Living  1            SAB TAB Ectopic Multiple Live Births               # Outcome Date GA Lbr Len/2nd Weight Sex Delivery Anes PTL Lv  1 Current            Past Surgical History:  Procedure Laterality Date  . ABSCESS DRAINAGE    . APPENDECTOMY    . CYST REMOVAL NECK  2018   Social History   Socioeconomic History  . Marital status: Single    Spouse name: Not on file  . Number of children: Not on file  . Years of education: Not on file  . Highest education level: Not on file  Occupational History  . Not on file  Social Needs  . Financial resource strain: Not on file  . Food insecurity    Worry: Not on file    Inability: Not on file  . Transportation needs    Medical: Not on file    Non-medical: Not on file  Tobacco Use  . Smoking status: Never Smoker  . Smokeless tobacco: Never Used  Substance and Sexual Activity  . Alcohol use: Not Currently    Comment: occ  . Drug use: No  . Sexual activity: Not Currently    Birth control/protection: None  Lifestyle  . Physical activity    Days per week: Not on file    Minutes per session: Not on file  . Stress: Not on file  Relationships  . Social Herbalist on phone: Not on file    Gets together: Not on  file    Attends religious service: Not on file    Active member of club or organization: Not on file    Attends meetings of clubs or organizations: Not on file    Relationship status: Not on file  . Intimate partner violence    Fear of current or ex partner: Not on file    Emotionally abused: Not on file    Physically abused: Not on file    Forced sexual activity: Not on file  Other Topics Concern  . Not on file  Social History Narrative  . Not on file   Family History  Problem Relation Age of Onset  . Heart disease Father   . Hypertension Father   . Diabetes Father    No current facility-administered medications on file prior to encounter.    No current outpatient medications on file prior to encounter.   Allergies  Allergen Reactions  . Codeine Nausea Only  . Oxycodone Nausea Only    I have reviewed patient's Past Medical Hx, Surgical Hx, Family Hx, Social Hx, medications and allergies.  Review of Systems  Constitutional: Negative.   Gastrointestinal: Positive for constipation and nausea. Negative for abdominal pain, diarrhea and vomiting.  Genitourinary: Negative.     OBJECTIVE Patient Vitals for the past 24 hrs:  BP Temp Temp src Pulse Resp SpO2 Height Weight  06/06/19 1532 111/65 - - 90 16 - - -  06/06/19 1214 117/75 98.2 F (36.8 C) Oral 89 18 100 % 5\' 5"  (1.651 m) 45.1 kg   Constitutional: Well-developed, well-nourished female in no acute distress.  Cardiovascular: normal rate & rhythm, no murmur Respiratory: normal rate and effort. Lung sounds clear throughout GI: Abd soft, non-tender, Pos BS x 4. No guarding or rebound tenderness MS: Extremities nontender, no edema, normal ROM Neurologic: Alert and oriented x 4.      LAB RESULTS Results for orders placed or performed during the hospital encounter of 06/06/19 (from the past 24 hour(s))  Urinalysis, Routine w reflex microscopic     Status: Abnormal   Collection Time: 06/06/19  1:53 PM  Result Value  Ref Range   Color, Urine YELLOW YELLOW   APPearance HAZY (A) CLEAR   Specific Gravity, Urine 1.018 1.005 - 1.030   pH 7.0 5.0 - 8.0   Glucose, UA NEGATIVE NEGATIVE mg/dL   Hgb urine dipstick NEGATIVE NEGATIVE   Bilirubin Urine NEGATIVE NEGATIVE   Ketones, ur NEGATIVE NEGATIVE mg/dL   Protein, ur NEGATIVE NEGATIVE mg/dL   Nitrite NEGATIVE NEGATIVE   Leukocytes,Ua NEGATIVE NEGATIVE    IMAGING No results found.  MAU COURSE Orders Placed This Encounter  Procedures  . Urinalysis, Routine w reflex microscopic  . Discharge patient   Meds ordered this encounter  Medications  . metoCLOPramide (REGLAN) tablet 10 mg  . metoCLOPramide (REGLAN) 10 MG tablet    Sig: Take 1 tablet (10 mg total) by mouth every 8 (eight) hours as needed for nausea.    Dispense:  30 tablet    Refill:  0    Order Specific Question:   Supervising Provider    Answer:   CONSTANT, PEGGY [[id number]]    MDM UPT positive U/a without signs of dehydration Denies abdominal pain of vaginal bleeding  Will treat with oral antiemetic in MAU. Pt reports improved symptoms after PO reglan & requests to be discharged homes.   Discussed tx options for constipation. Pt prefers at home management. No pain & abdomen soft. Will give instructions for clean out & constipation treatment.  ASSESSMENT 1. Pregnancy related nausea, antepartum   2. Constipation during pregnancy in first trimester     PLAN Discharge home in stable condition. Discussed reasons to return to MAU Rx reglan Given written info for constipation management Start prenatal care  Follow-up Information    [facility] Follow up.   Specialty: Obstetrics and Gynecology Why: return for worsening symptoms Contact information: [address] [id number] [place] [id number] [phone]         Allergies as of 06/06/2019      Reactions   Codeine Nausea Only   Oxycodone Nausea Only      Medication List     STOP taking these medications   benzonatate 100 MG capsule Commonly known as: Tessalon Perles   traMADol 50 MG tablet Commonly known as: ULTRAM     TAKE these medications   metoCLOPramide 10 MG tablet Commonly known as: REGLAN Take 1 tablet (10 mg total) by mouth every 8 (eight) hours as needed for nausea.        Lyman BishopLawrence,  Denny Peon, NP 06/06/2019  6:45 PM

## 2019-06-06 NOTE — ED Notes (Signed)
Called x 3 no answer 

## 2019-06-06 NOTE — MAU Note (Signed)
Pt reports N/V starting two days ago. Pt states she vomitted once, and the nausea continued. Pt reports being constipated for about a week.

## 2019-06-13 ENCOUNTER — Inpatient Hospital Stay (HOSPITAL_COMMUNITY): Payer: Medicaid Other

## 2019-06-13 ENCOUNTER — Encounter (HOSPITAL_COMMUNITY): Payer: Self-pay

## 2019-06-13 ENCOUNTER — Other Ambulatory Visit: Payer: Self-pay

## 2019-06-13 ENCOUNTER — Inpatient Hospital Stay (HOSPITAL_COMMUNITY)
Admission: AD | Admit: 2019-06-13 | Discharge: 2019-06-14 | Disposition: A | Discharge status: Home / Self Care | Payer: Medicaid Other | Attending: Obstetrics and Gynecology | Admitting: Obstetrics and Gynecology

## 2019-06-13 DIAGNOSIS — B9689 Other specified bacterial agents as the cause of diseases classified elsewhere: Secondary | ICD-10-CM

## 2019-06-13 DIAGNOSIS — Z3A01 Less than 8 weeks gestation of pregnancy: Secondary | ICD-10-CM | POA: Insufficient documentation

## 2019-06-13 DIAGNOSIS — O26891 Other specified pregnancy related conditions, first trimester: Secondary | ICD-10-CM | POA: Insufficient documentation

## 2019-06-13 DIAGNOSIS — R109 Unspecified abdominal pain: Secondary | ICD-10-CM | POA: Diagnosis not present

## 2019-06-13 LAB — ABO/RH: ABO/RH(D): A POS

## 2019-06-13 LAB — POCT PREGNANCY, URINE: Preg Test, Ur: POSITIVE — AB
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
## 2019-06-14 LAB — WET PREP, GENITAL
Sperm: NONE SEEN
Trich, Wet Prep: NONE SEEN
Yeast Wet Prep HPF POC: NONE SEEN

## 2019-06-14 LAB — HCG, QUANTITATIVE, PREGNANCY: hCG, Beta Chain, Quant, S: 91755 m[IU]/mL — ABNORMAL HIGH (ref <5)

## 2019-06-14 LAB — CBC
HCT: 33.9 % — ABNORMAL LOW (ref 36.0–46.0)
Hemoglobin: 11.2 g/dL — ABNORMAL LOW (ref 12.0–15.0)
MCH: 30.8 pg (ref 26.0–34.0)
MCHC: 33 g/dL (ref 30.0–36.0)
MCV: 93.1 fL (ref 80.0–100.0)
Platelets: 270 10*3/uL (ref 150–400)
RBC: 3.64 MIL/uL — ABNORMAL LOW (ref 3.87–5.11)
RDW: 12.5 % (ref 11.5–15.5)
WBC: 10 10*3/uL (ref 4.0–10.5)
nRBC: 0 % (ref 0.0–0.2)

## 2019-06-14 MED ORDER — METRONIDAZOLE 0.75 % VA GEL: 1.0000 | Freq: Every day | VAGINAL | 0 refills | Status: DC

## 2019-06-14 MED ORDER — ACETAMINOPHEN 500 MG PO TABS
1000.0000 mg | ORAL_TABLET | Freq: Once | ORAL | Status: AC
  Administered 2019-06-14: 1000 mg via ORAL
  Filled 2019-06-14: qty 2

## 2019-06-14 NOTE — Discharge Instructions (Signed)
Bacterial Vaginosis  Bacterial vaginosis is a vaginal infection that occurs when the normal balance of bacteria in the vagina is disrupted. It results from an overgrowth of certain bacteria. This is the most common vaginal infection among women ages 71-44. Because bacterial vaginosis increases your risk for STIs (sexually transmitted infections), getting treated can help reduce your risk for chlamydia, gonorrhea, herpes, and HIV (human immunodeficiency virus). Treatment is also important for preventing complications in pregnant women, because this condition can cause an early (premature) delivery. What are the causes? This condition is caused by an increase in harmful bacteria that are normally present in small amounts in the vagina. However, the reason that the condition develops is not fully understood. What increases the risk? The following factors may make you more likely to develop this condition:  Having a new sexual partner or multiple sexual partners.  Having unprotected sex.  Douching.  Having an intrauterine device (IUD).  Smoking.  Drug and alcohol abuse.  Taking certain antibiotic medicines.  Being pregnant. You cannot get bacterial vaginosis from toilet seats, bedding, swimming pools, or contact with objects around you. What are the signs or symptoms? Symptoms of this condition include:  Grey or white vaginal discharge. The discharge can also be watery or foamy.  A fish-like odor with discharge, especially after sexual intercourse or during menstruation.  Itching in and around the vagina.  Burning or pain with urination. Some women with bacterial vaginosis have no signs or symptoms. How is this diagnosed? This condition is diagnosed based on:  Your medical history.  A physical exam of the vagina.  Testing a sample of vaginal fluid under a microscope to look for a large amount of bad bacteria or abnormal cells. Your health care provider may use a cotton swab or  a small wooden spatula to collect the sample. How is this treated? This condition is treated with antibiotics. These may be given as a pill, a vaginal cream, or a medicine that is put into the vagina (suppository). If the condition comes back after treatment, a second round of antibiotics may be needed. Follow these instructions at home: Medicines  Take over-the-counter and prescription medicines only as told by your health care provider.  Take or use your antibiotic as told by your health care provider. Do not stop taking or using the antibiotic even if you start to feel better. General instructions  If you have a female sexual partner, tell her that you have a vaginal infection. She should see her health care provider and be treated if she has symptoms. If you have a female sexual partner, he does not need treatment.  During treatment: ? Avoid sexual activity until you finish treatment. ? Do not douche. ? Avoid alcohol as directed by your health care provider. ? Avoid breastfeeding as directed by your health care provider.  Drink enough water and fluids to keep your urine clear or pale yellow.  Keep the area around your vagina and rectum clean. ? Wash the area daily with warm water. ? Wipe yourself from front to back after using the toilet.  Keep all follow-up visits as told by your health care provider. This is important. How is this prevented?  Do not douche.  Wash the outside of your vagina with warm water only.  Use protection when having sex. This includes latex condoms and dental dams.  Limit how many sexual partners you have. To help prevent bacterial vaginosis, it is best to have sex with just one partner (  monogamous).  Make sure you and your sexual partner are tested for STIs.  Wear cotton or cotton-lined underwear.  Avoid wearing tight pants and pantyhose, especially during summer.  Limit the amount of alcohol that you drink.  Do not use any products that contain  nicotine or tobacco, such as cigarettes and e-cigarettes. If you need help quitting, ask your health care provider.  Do not use illegal drugs. Where to find more information  Centers for Disease Control and Prevention: AppraiserFraud.fi  American Sexual Health Association (ASHA): www.ashastd.org  U.S. Department of Health and Financial controller, Office on Women's Health: DustingSprays.pl or SecuritiesCard.it Contact a health care provider if:  Your symptoms do not improve, even after treatment.  You have more discharge or pain when urinating.  You have a fever.  You have pain in your abdomen.  You have pain during sex.  You have vaginal bleeding between periods. Summary  Bacterial vaginosis is a vaginal infection that occurs when the normal balance of bacteria in the vagina is disrupted.  Because bacterial vaginosis increases your risk for STIs (sexually transmitted infections), getting treated can help reduce your risk for chlamydia, gonorrhea, herpes, and HIV (human immunodeficiency virus). Treatment is also important for preventing complications in pregnant women, because the condition can cause an early (premature) delivery.  This condition is treated with antibiotic medicines. These may be given as a pill, a vaginal cream, or a medicine that is put into the vagina (suppository). This information is not intended to replace advice given to you by your health care provider. Make sure you discuss any questions you have with your health care provider. Document Released: 09/02/2005 Document Revised: 08/15/2017 Document Reviewed: 05/18/2016 Elsevier Patient Education  2020 [place] Hematoma  A subchorionic hematoma is a gathering of blood between the outer wall of the embryo (chorion) and the inner wall of the womb (uterus). This condition can cause vaginal bleeding. If they cause little or no vaginal bleeding, early small  hematomas usually shrink on their own and do not affect your baby or pregnancy. When bleeding starts later in pregnancy, or if the hematoma is larger or occurs in older pregnant women, the condition may be more serious. Larger hematomas may get bigger, which increases the chances of miscarriage. This condition also increases the risk of:  Premature separation of the placenta from the uterus.  Premature (preterm) labor.  Stillbirth. What are the causes? The exact cause of this condition is not known. It occurs when blood is trapped between the placenta and the uterine wall because the placenta has separated from the original site of implantation. What increases the risk? You are more likely to develop this condition if:  You were treated with fertility medicines.  You conceived through in vitro fertilization (IVF). What are the signs or symptoms? Symptoms of this condition include:  Vaginal spotting or bleeding.  Contractions of the uterus. These cause abdominal pain. Sometimes you may have no symptoms and the bleeding may only be seen when ultrasound images are taken (transvaginal ultrasound). How is this diagnosed? This condition is diagnosed based on a physical exam. This includes a pelvic exam. You may also have other tests, including:  Blood tests.  Urine tests.  Ultrasound of the abdomen. How is this treated? Treatment for this condition can vary. Treatment may include:  Watchful waiting. You will be monitored closely for any changes in bleeding. During this stage: ? The hematoma may be reabsorbed by the body. ? The hematoma may separate  the fluid-filled space containing the embryo (gestational sac) from the wall of the womb (endometrium).  Medicines.  Activity restriction. This may be needed until the bleeding stops. Follow these instructions at home:  Stay on bed rest if told to do so by your health care provider.  Do not lift anything that is heavier than 10 lbs.  (4.5 kg) or as told by your health care provider.  Do not use any products that contain nicotine or tobacco, such as cigarettes and e-cigarettes. If you need help quitting, ask your health care provider.  Track and write down the number of pads you use each day and how soaked (saturated) they are.  Do not use tampons.  Keep all follow-up visits as told by your health care provider. This is important. Your health care provider may ask you to have follow-up blood tests or ultrasound tests or both. Contact a health care provider if:  You have any vaginal bleeding.  You have a fever. Get help right away if:  You have severe cramps in your stomach, back, abdomen, or pelvis.  You pass large clots or tissue. Save any tissue for your health care provider to look at.  You have more vaginal bleeding, and you faint or become lightheaded or weak. Summary  A subchorionic hematoma is a gathering of blood between the outer wall of the placenta and the uterus.  This condition can cause vaginal bleeding.  Sometimes you may have no symptoms and the bleeding may only be seen when ultrasound images are taken.  Treatment may include watchful waiting, medicines, or activity restriction. This information is not intended to replace advice given to you by your health care provider. Make sure you discuss any questions you have with your health care provider. Document Released: 12/18/2006 Document Revised: 08/15/2017 Document Reviewed: 10/29/2016 Elsevier Patient Education  2020 [employer].   Safe Medications in Pregnancy   Acne: Benzoyl Peroxide Salicylic Acid  Backache/Headache: Tylenol: 2 regular strength every 4 hours OR              2 Extra strength every 6 hours  Colds/Coughs/Allergies: Benadryl (alcohol free) 25 mg every 6 hours as needed Breath right strips Claritin Cepacol throat lozenges Chloraseptic throat spray Cold-Eeze- up to three times per day Cough drops, alcohol  free Flonase (by prescription only) Guaifenesin Mucinex Robitussin DM (plain only, alcohol free) Saline nasal spray/drops Sudafed (pseudoephedrine) & Actifed ** use only after [redacted] weeks gestation and if you do not have high blood pressure Tylenol Vicks Vaporub Zinc lozenges Zyrtec   Constipation: Colace Ducolax suppositories Fleet enema Glycerin suppositories Metamucil Milk of magnesia Miralax Senokot Smooth move tea  Diarrhea: Kaopectate Imodium A-D  *NO pepto Bismol  Hemorrhoids: Anusol Anusol HC Preparation H Tucks  Indigestion: Tums Maalox Mylanta Zantac  Pepcid  Insomnia: Benadryl (alcohol free) 25mg  every 6 hours as needed Tylenol PM Unisom, no Gelcaps  Leg Cramps: Tums MagGel  Nausea/Vomiting:  Bonine Dramamine Emetrol Ginger extract Sea bands Meclizine  Nausea medication to take during pregnancy:  Unisom (doxylamine succinate 25 mg tablets) Take one tablet daily at bedtime. If symptoms are not adequately controlled, the dose can be increased to a maximum recommended dose of two tablets daily (1/2 tablet in the morning, 1/2 tablet mid-afternoon and one at bedtime). Vitamin B6 100mg  tablets. Take one tablet twice a day (up to 200 mg per day).  Skin Rashes: Aveeno products Benadryl cream or 25mg  every 6 hours as needed Calamine Lotion 1% cortisone cream  Yeast infection: Gyne-lotrimin 7 Monistat 7  Gum/tooth pain: Anbesol  **If taking multiple medications, please check labels to avoid duplicating the same active ingredients **take medication as directed on the label ** Do not exceed 4000 mg of tylenol in 24 hours **Do not take medications that contain aspirin or ibuprofen

## 2019-06-15 LAB — CERVICOVAGINAL ANCILLARY ONLY
Chlamydia: NEGATIVE
Neisseria Gonorrhea: NEGATIVE

## 2019-07-12 ENCOUNTER — Ambulatory Visit (INDEPENDENT_AMBULATORY_CARE_PROVIDER_SITE_OTHER): Payer: Self-pay | Admitting: *Deleted

## 2019-07-12 ENCOUNTER — Other Ambulatory Visit: Payer: Self-pay

## 2019-07-12 DIAGNOSIS — F4325 Adjustment disorder with mixed disturbance of emotions and conduct: Secondary | ICD-10-CM

## 2019-07-12 DIAGNOSIS — Z3490 Encounter for supervision of normal pregnancy, unspecified, unspecified trimester: Secondary | ICD-10-CM | POA: Insufficient documentation

## 2019-07-12 NOTE — Patient Instructions (Signed)

## 2019-07-12 NOTE — Progress Notes (Signed)
I connected with  [facility] on 07/12/19 at  8:30 AM EDT by telephone and verified that I am speaking with the correct person using two identifiers.   I discussed the limitations, risks, security and privacy concerns of performing an evaluation and management service by telephone and the availability of in person appointments. I also discussed with the patient that there may be a patient responsible charge related to this service. The patient expressed understanding and agreed to proceed. Explained I am completing her New OB Intake today. We discussed Her EDD and that it is based on  sure LMP . I reviewed her allergies, meds, OB History, Medical /Surgical history, and appropriate screenings. I explained I will send her the [employer] app- app sent to her while on phone-but she had not yet received it- will let us know if she does not get it.  I explained we will order a blood pressure cuff  Once she has insurance since she does not have a blood pressure cuff. Explained  then we will have her take her blood pressure weekly and enter into the [employer] app. Explained she will have some visits in office and some virtually. She already has MyChart and I instructed her on downloading the app which she did while on the phone. I reviewed her  appointment date/ time with her , our location and to wear mask, no visitors. Explained she will have exam, ob bloodwork, hemoglobin a1C, cbg , genetic testing if desired, pap . I scheduled an [place] at 19 weeks and gave her the appointment. I also offered her referral to [facility] with Roselyn Reef because of her history and she accepted the referral.  She voices understanding.   Linda,RN 07/12/2019  8:25 AM

## 2019-07-13 ENCOUNTER — Telehealth: Payer: Self-pay | Admitting: Family Medicine

## 2019-07-13 NOTE — Telephone Encounter (Signed)
Received call from [employer] due to critical BP or 174/89. Attempted to call patient but no answer. Will continue to try to call.

## 2019-07-13 NOTE — Telephone Encounter (Signed)
Attempted to call again - no answer and not able to leave VM.

## 2019-07-16 NOTE — BH Specialist Note (Signed)
Pt did not arrive to video visit and did not answer the phone; unable to leave a voice message; left MyChart message for patient.   Integrated Behavioral Health via Telemedicine Video Visit  07/16/2019 [place] [phone]  Garlan Fair  Depression screen [facility] 2/9 07/12/2019  Decreased Interest 0  Down, Depressed, Hopeless 0  PHQ - 2 Score 0  Altered sleeping 0  Tired, decreased energy 0  Change in appetite 0  Feeling bad or failure about yourself  0  Trouble concentrating 0  Moving slowly or fidgety/restless 0  Suicidal thoughts 0  PHQ-9 Score 0   GAD 7 : Generalized Anxiety Score 07/12/2019  Nervous, Anxious, on Edge 1  Control/stop worrying 0  Worry too much - different things 0  Trouble relaxing 0  Restless 0  Easily annoyed or irritable 1  Afraid - awful might happen 0  Total GAD 7 Score 2

## 2019-07-19 ENCOUNTER — Other Ambulatory Visit: Payer: Self-pay

## 2019-07-19 ENCOUNTER — Ambulatory Visit: Payer: Self-pay | Admitting: Clinical

## 2019-07-19 DIAGNOSIS — Z5329 Procedure and treatment not carried out because of patient's decision for other reasons: Secondary | ICD-10-CM

## 2019-07-19 DIAGNOSIS — Z91199 Patient's noncompliance with other medical treatment and regimen due to unspecified reason: Secondary | ICD-10-CM
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
## 2019-07-26 ENCOUNTER — Encounter: Payer: Self-pay | Admitting: Family Medicine

## 2019-07-26 ENCOUNTER — Ambulatory Visit (INDEPENDENT_AMBULATORY_CARE_PROVIDER_SITE_OTHER): Payer: Medicaid Other | Admitting: Family Medicine

## 2019-07-26 ENCOUNTER — Other Ambulatory Visit (HOSPITAL_COMMUNITY)
Admission: RE | Admit: 2019-07-26 | Discharge: 2019-07-26 | Disposition: A | Discharge status: Home / Self Care | Payer: Medicaid Other | Source: Ambulatory Visit | Attending: Family Medicine | Admitting: Family Medicine

## 2019-07-26 ENCOUNTER — Other Ambulatory Visit: Payer: Self-pay

## 2019-07-26 ENCOUNTER — Ambulatory Visit (INDEPENDENT_AMBULATORY_CARE_PROVIDER_SITE_OTHER): Payer: Medicaid Other | Admitting: Clinical

## 2019-07-26 VITALS — BP 113/83 | HR 96 | Wt 97.9 lb

## 2019-07-26 DIAGNOSIS — Z23 Encounter for immunization: Secondary | ICD-10-CM | POA: Diagnosis not present

## 2019-07-26 DIAGNOSIS — Z3490 Encounter for supervision of normal pregnancy, unspecified, unspecified trimester: Secondary | ICD-10-CM | POA: Insufficient documentation

## 2019-07-26 DIAGNOSIS — Z658 Other specified problems related to psychosocial circumstances: Secondary | ICD-10-CM | POA: Diagnosis not present

## 2019-07-26 DIAGNOSIS — Z3A13 13 weeks gestation of pregnancy: Secondary | ICD-10-CM

## 2019-07-26 DIAGNOSIS — Z3491 Encounter for supervision of normal pregnancy, unspecified, first trimester: Secondary | ICD-10-CM

## 2019-07-26 DIAGNOSIS — F4325 Adjustment disorder with mixed disturbance of emotions and conduct: Secondary | ICD-10-CM

## 2019-07-26 NOTE — Progress Notes (Addendum)
Pt reports Bartholin's cyst ruptured overnight; still experiencing drainage. Pt reports she is applying for Pregnancy Medicaid; will need Medicaid home form once approved.
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
## 2019-07-26 NOTE — Patient Instructions (Signed)
 First Trimester of Pregnancy The first trimester of pregnancy is from week 1 until the end of week 13 (months 1 through 3). A week after a sperm fertilizes an egg, the egg will implant on the wall of the uterus. This embryo will begin to develop into a baby. Genes from you and your partner will form the baby. The female genes will determine whether the baby will be a boy or a girl. At 6-8 weeks, the eyes and face will be formed, and the heartbeat can be seen on ultrasound. At the end of 12 weeks, all the baby's organs will be formed. Now that you are pregnant, you will want to do everything you can to have a healthy baby. Two of the most important things are to get good prenatal care and to follow your health care provider's instructions. Prenatal care is all the medical care you receive before the baby's birth. This care will help prevent, find, and treat any problems during the pregnancy and childbirth. Body changes during your first trimester Your body goes through many changes during pregnancy. The changes vary from woman to woman.  You may gain or lose a couple of pounds at first.  You may feel sick to your stomach (nauseous) and you may throw up (vomit). If the vomiting is uncontrollable, call your health care provider.  You may tire easily.  You may develop headaches that can be relieved by medicines. All medicines should be approved by your health care provider.  You may urinate more often. Painful urination may mean you have a bladder infection.  You may develop heartburn as a result of your pregnancy.  You may develop constipation because certain hormones are causing the muscles that push stool through your intestines to slow down.  You may develop hemorrhoids or swollen veins (varicose veins).  Your breasts may begin to grow larger and become tender. Your nipples may stick out more, and the tissue that surrounds them (areola) may become darker.  Your gums may bleed and may be  sensitive to brushing and flossing.  Dark spots or blotches (chloasma, mask of pregnancy) may develop on your face. This will likely fade after the baby is born.  Your menstrual periods will stop.  You may have a loss of appetite.  You may develop cravings for certain kinds of food.  You may have changes in your emotions from day to day, such as being excited to be pregnant or being concerned that something may go wrong with the pregnancy and baby.  You may have more vivid and strange dreams.  You may have changes in your hair. These can include thickening of your hair, rapid growth, and changes in texture. Some women also have hair loss during or after pregnancy, or hair that feels dry or thin. Your hair will most likely return to normal after your baby is born. What to expect at prenatal visits During a routine prenatal visit:  You will be weighed to make sure you and the baby are growing normally.  Your blood pressure will be taken.  Your abdomen will be measured to track your baby's growth.  The fetal heartbeat will be listened to between weeks 10 and 14 of your pregnancy.  Test results from any previous visits will be discussed. Your health care provider may ask you:  How you are feeling.  If you are feeling the baby move.  If you have had any abnormal symptoms, such as leaking fluid, bleeding, severe headaches, or   abdominal cramping.  If you are using any tobacco products, including cigarettes, chewing tobacco, and electronic cigarettes.  If you have any questions. Other tests that may be performed during your first trimester include:  Blood tests to find your blood type and to check for the presence of any previous infections. The tests will also be used to check for low iron levels (anemia) and protein on red blood cells (Rh antibodies). Depending on your risk factors, or if you previously had diabetes during pregnancy, you may have tests to check for high blood sugar  that affects pregnant women (gestational diabetes).  Urine tests to check for infections, diabetes, or protein in the urine.  An ultrasound to confirm the proper growth and development of the baby.  Fetal screens for spinal cord problems (spina bifida) and Down syndrome.  HIV (human immunodeficiency virus) testing. Routine prenatal testing includes screening for HIV, unless you choose not to have this test.  You may need other tests to make sure you and the baby are doing well. Follow these instructions at home: Medicines  Follow your health care provider's instructions regarding medicine use. Specific medicines may be either safe or unsafe to take during pregnancy.  Take a prenatal vitamin that contains at least 600 micrograms (mcg) of folic acid.  If you develop constipation, try taking a stool softener if your health care provider approves. Eating and drinking   Eat a balanced diet that includes fresh fruits and vegetables, whole grains, good sources of protein such as meat, eggs, or tofu, and low-fat dairy. Your health care provider will help you determine the amount of weight gain that is right for you.  Avoid raw meat and uncooked cheese. These carry germs that can cause birth defects in the baby.  Eating four or five small meals rather than three large meals a day may help relieve nausea and vomiting. If you start to feel nauseous, eating a few soda crackers can be helpful. Drinking liquids between meals, instead of during meals, also seems to help ease nausea and vomiting.  Limit foods that are high in fat and processed sugars, such as fried and sweet foods.  To prevent constipation: ? Eat foods that are high in fiber, such as fresh fruits and vegetables, whole grains, and beans. ? Drink enough fluid to keep your urine clear or pale yellow. Activity  Exercise only as directed by your health care provider. Most women can continue their usual exercise routine during  pregnancy. Try to exercise for 30 minutes at least 5 days a week. Exercising will help you: ? Control your weight. ? Stay in shape. ? Be prepared for labor and delivery.  Experiencing pain or cramping in the lower abdomen or lower back is a good sign that you should stop exercising. Check with your health care provider before continuing with normal exercises.  Try to avoid standing for long periods of time. Move your legs often if you must stand in one place for a long time.  Avoid heavy lifting.  Wear low-heeled shoes and practice good posture.  You may continue to have sex unless your health care provider tells you not to. Relieving pain and discomfort  Wear a good support bra to relieve breast tenderness.  Take warm sitz baths to soothe any pain or discomfort caused by hemorrhoids. Use hemorrhoid cream if your health care provider approves.  Rest with your legs elevated if you have leg cramps or low back pain.  If you develop varicose veins   in your legs, wear support hose. Elevate your feet for 15 minutes, 3-4 times a day. Limit salt in your diet. Prenatal care  Schedule your prenatal visits by the twelfth week of pregnancy. They are usually scheduled monthly at first, then more often in the last 2 months before delivery.  Write down your questions. Take them to your prenatal visits.  Keep all your prenatal visits as told by your health care provider. This is important. Safety  Wear your seat belt at all times when driving.  Make a list of emergency phone numbers, including numbers for family, friends, the hospital, and police and fire departments. General instructions  Ask your health care provider for a referral to a local prenatal education class. Begin classes no later than the beginning of month 6 of your pregnancy.  Ask for help if you have counseling or nutritional needs during pregnancy. Your health care provider can offer advice or refer you to specialists for help  with various needs.  Do not use hot tubs, steam rooms, or saunas.  Do not douche or use tampons or scented sanitary pads.  Do not cross your legs for long periods of time.  Avoid cat litter boxes and soil used by cats. These carry germs that can cause birth defects in the baby and possibly loss of the fetus by miscarriage or stillbirth.  Avoid all smoking, herbs, alcohol, and medicines not prescribed by your health care provider. Chemicals in these products affect the formation and growth of the baby.  Do not use any products that contain nicotine or tobacco, such as cigarettes and e-cigarettes. If you need help quitting, ask your health care provider. You may receive counseling support and other resources to help you quit.  Schedule a dentist appointment. At home, brush your teeth with a soft toothbrush and be gentle when you floss. Contact a health care provider if:  You have dizziness.  You have mild pelvic cramps, pelvic pressure, or nagging pain in the abdominal area.  You have persistent nausea, vomiting, or diarrhea.  You have a bad smelling vaginal discharge.  You have pain when you urinate.  You notice increased swelling in your face, hands, legs, or ankles.  You are exposed to fifth disease or chickenpox.  You are exposed to [place] measles (rubella) and have never had it. Get help right away if:  You have a fever.  You are leaking fluid from your vagina.  You have spotting or bleeding from your vagina.  You have severe abdominal cramping or pain.  You have rapid weight gain or loss.  You vomit blood or material that looks like coffee grounds.  You develop a severe headache.  You have shortness of breath.  You have any kind of trauma, such as from a fall or a car accident. Summary  The first trimester of pregnancy is from week 1 until the end of week 13 (months 1 through 3).  Your body goes through many changes during pregnancy. The changes vary from  woman to woman.  You will have routine prenatal visits. During those visits, your health care provider will examine you, discuss any test results you may have, and talk with you about how you are feeling. This information is not intended to replace advice given to you by your health care provider. Make sure you discuss any questions you have with your health care provider. Document Released: 08/27/2001 Document Revised: 08/15/2017 Document Reviewed: 08/14/2016 Elsevier Patient Education  2020 [employer].  Breastfeeding  Choosing to breastfeed is one of the best decisions you can make for yourself and your baby. A change in hormones during pregnancy causes your breasts to make breast milk in your milk-producing glands. Hormones prevent breast milk from being released before your baby is born. They also prompt milk flow after birth. Once breastfeeding has begun, thoughts of your baby, as well as his or her sucking or crying, can stimulate the release of milk from your milk-producing glands. Benefits of breastfeeding Research shows that breastfeeding offers many health benefits for infants and mothers. It also offers a cost-free and convenient way to feed your baby. For your baby  Your first milk (colostrum) helps your baby's digestive system to function better.  Special cells in your milk (antibodies) help your baby to fight off infections.  Breastfed babies are less likely to develop asthma, allergies, obesity, or type 2 diabetes. They are also at lower risk for sudden infant death syndrome (SIDS).  Nutrients in breast milk are better able to meet your baby's needs compared to infant formula.  Breast milk improves your baby's brain development. For you  Breastfeeding helps to create a very special bond between you and your baby.  Breastfeeding is convenient. Breast milk costs nothing and is always available at the correct temperature.  Breastfeeding helps to burn calories. It helps  you to lose the weight that you gained during pregnancy.  Breastfeeding makes your uterus return faster to its size before pregnancy. It also slows bleeding (lochia) after you give birth.  Breastfeeding helps to lower your risk of developing type 2 diabetes, osteoporosis, rheumatoid arthritis, cardiovascular disease, and breast, ovarian, uterine, and endometrial cancer later in life. Breastfeeding basics Starting breastfeeding  Find a comfortable place to sit or lie down, with your neck and back well-supported.  Place a pillow or a rolled-up blanket under your baby to bring him or her to the level of your breast (if you are seated). Nursing pillows are specially designed to help support your arms and your baby while you breastfeed.  Make sure that your baby's tummy (abdomen) is facing your abdomen.  Gently massage your breast. With your fingertips, massage from the outer edges of your breast inward toward the nipple. This encourages milk flow. If your milk flows slowly, you may need to continue this action during the feeding.  Support your breast with 4 fingers underneath and your thumb above your nipple (make the letter "C" with your hand). Make sure your fingers are well away from your nipple and your baby's mouth.  Stroke your baby's lips gently with your finger or nipple.  When your baby's mouth is open wide enough, quickly bring your baby to your breast, placing your entire nipple and as much of the areola as possible into your baby's mouth. The areola is the colored area around your nipple. ? More areola should be visible above your baby's upper lip than below the lower lip. ? Your baby's lips should be opened and extended outward (flanged) to ensure an adequate, comfortable latch. ? Your baby's tongue should be between his or her lower gum and your breast.  Make sure that your baby's mouth is correctly positioned around your nipple (latched). Your baby's lips should create a seal on  your breast and be turned out (everted).  It is common for your baby to suck about 2-3 minutes in order to start the flow of breast milk. Latching Teaching your baby how to latch onto your breast properly  is very important. An improper latch can cause nipple pain, decreased milk supply, and poor weight gain in your baby. Also, if your baby is not latched onto your nipple properly, he or she may swallow some air during feeding. This can make your baby fussy. Burping your baby when you switch breasts during the feeding can help to get rid of the air. However, teaching your baby to latch on properly is still the best way to prevent fussiness from swallowing air while breastfeeding. Signs that your baby has successfully latched onto your nipple  Silent tugging or silent sucking, without causing you pain. Infant's lips should be extended outward (flanged).  Swallowing heard between every 3-4 sucks once your milk has started to flow (after your let-down milk reflex occurs).  Muscle movement above and in front of his or her ears while sucking. Signs that your baby has not successfully latched onto your nipple  Sucking sounds or smacking sounds from your baby while breastfeeding.  Nipple pain. If you think your baby has not latched on correctly, slip your finger into the corner of your baby's mouth to break the suction and place it between your baby's gums. Attempt to start breastfeeding again. Signs of successful breastfeeding Signs from your baby  Your baby will gradually decrease the number of sucks or will completely stop sucking.  Your baby will fall asleep.  Your baby's body will relax.  Your baby will retain a small amount of milk in his or her mouth.  Your baby will let go of your breast by himself or herself. Signs from you  Breasts that have increased in firmness, weight, and size 1-3 hours after feeding.  Breasts that are softer immediately after breastfeeding.  Increased milk  volume, as well as a change in milk consistency and color by the fifth day of breastfeeding.  Nipples that are not sore, cracked, or bleeding. Signs that your baby is getting enough milk  Wetting at least 1-2 diapers during the first 24 hours after birth.  Wetting at least 5-6 diapers every 24 hours for the first week after birth. The urine should be clear or pale yellow by the age of 5 days.  Wetting 6-8 diapers every 24 hours as your baby continues to grow and develop.  At least 3 stools in a 24-hour period by the age of 5 days. The stool should be soft and yellow.  At least 3 stools in a 24-hour period by the age of 7 days. The stool should be seedy and yellow.  No loss of weight greater than 10% of birth weight during the first 3 days of life.  Average weight gain of 4-7 oz (113-198 g) per week after the age of 4 days.  Consistent daily weight gain by the age of 5 days, without weight loss after the age of 2 weeks. After a feeding, your baby may spit up a small amount of milk. This is normal. Breastfeeding frequency and duration Frequent feeding will help you make more milk and can prevent sore nipples and extremely full breasts (breast engorgement). Breastfeed when you feel the need to reduce the fullness of your breasts or when your baby shows signs of hunger. This is called "breastfeeding on demand." Signs that your baby is hungry include:  Increased alertness, activity, or restlessness.  Movement of the head from side to side.  Opening of the mouth when the corner of the mouth or cheek is stroked (rooting).  Increased sucking sounds, smacking lips, cooing,   sighing, or squeaking.  Hand-to-mouth movements and sucking on fingers or hands.  Fussing or crying. Avoid introducing a pacifier to your baby in the first 4-6 weeks after your baby is born. After this time, you may choose to use a pacifier. Research has shown that pacifier use during the first year of a baby's life  decreases the risk of sudden infant death syndrome (SIDS). Allow your baby to feed on each breast as long as he or she wants. When your baby unlatches or falls asleep while feeding from the first breast, offer the second breast. Because newborns are often sleepy in the first few weeks of life, you may need to awaken your baby to get him or her to feed. Breastfeeding times will vary from baby to baby. However, the following rules can serve as a guide to help you make sure that your baby is properly fed:  Newborns (babies 28 weeks of age or younger) may breastfeed every 1-3 hours.  Newborns should not go without breastfeeding for longer than 3 hours during the day or 5 hours during the night.  You should breastfeed your baby a minimum of 8 times in a 24-hour period. Breast milk pumping     Pumping and storing breast milk allows you to make sure that your baby is exclusively fed your breast milk, even at times when you are unable to breastfeed. This is especially important if you go back to work while you are still breastfeeding, or if you are not able to be present during feedings. Your lactation consultant can help you find a method of pumping that works best for you and give you guidelines about how long it is safe to store breast milk. Caring for your breasts while you breastfeed Nipples can become dry, cracked, and sore while breastfeeding. The following recommendations can help keep your breasts moisturized and healthy:  Avoid using soap on your nipples.  Wear a supportive bra designed especially for nursing. Avoid wearing underwire-style bras or extremely tight bras (sports bras).  Air-dry your nipples for 3-4 minutes after each feeding.  Use only cotton bra pads to absorb leaked breast milk. Leaking of breast milk between feedings is normal.  Use lanolin on your nipples after breastfeeding. Lanolin helps to maintain your skin's normal moisture barrier. Pure lanolin is not harmful (not  toxic) to your baby. You may also hand express a few drops of breast milk and gently massage that milk into your nipples and allow the milk to air-dry. In the first few weeks after giving birth, some women experience breast engorgement. Engorgement can make your breasts feel heavy, warm, and tender to the touch. Engorgement peaks within 3-5 days after you give birth. The following recommendations can help to ease engorgement:  Completely empty your breasts while breastfeeding or pumping. You may want to start by applying warm, moist heat (in the shower or with warm, water-soaked hand towels) just before feeding or pumping. This increases circulation and helps the milk flow. If your baby does not completely empty your breasts while breastfeeding, pump any extra milk after he or she is finished.  Apply ice packs to your breasts immediately after breastfeeding or pumping, unless this is too uncomfortable for you. To do this: ? Put ice in a plastic bag. ? Place a towel between your skin and the bag. ? Leave the ice on for 20 minutes, 2-3 times a day.  Make sure that your baby is latched on and positioned properly while breastfeeding. If  engorgement persists after 48 hours of following these recommendations, contact your health care provider or a Advertising copywriterlactation consultant. Overall health care recommendations while breastfeeding  Eat 3 healthy meals and 3 snacks every day. Well-nourished mothers who are breastfeeding need an additional 450-500 calories a day. You can meet this requirement by increasing the amount of a balanced diet that you eat.  Drink enough water to keep your urine pale yellow or clear.  Rest often, relax, and continue to take your prenatal vitamins to prevent fatigue, stress, and low vitamin and mineral levels in your body (nutrient deficiencies).  Do not use any products that contain nicotine or tobacco, such as cigarettes and e-cigarettes. Your baby may be harmed by chemicals from  cigarettes that pass into breast milk and exposure to secondhand smoke. If you need help quitting, ask your health care provider.  Avoid alcohol.  Do not use illegal drugs or marijuana.  Talk with your health care provider before taking any medicines. These include over-the-counter and prescription medicines as well as vitamins and herbal supplements. Some medicines that may be harmful to your baby can pass through breast milk.  It is possible to become pregnant while breastfeeding. If birth control is desired, ask your health care provider about options that will be safe while breastfeeding your baby. Where to find more information: [employer]: www.llli.org Contact a health care provider if:  You feel like you want to stop breastfeeding or have become frustrated with breastfeeding.  Your nipples are cracked or bleeding.  Your breasts are red, tender, or warm.  You have: ? Painful breasts or nipples. ? A swollen area on either breast. ? A fever or chills. ? Nausea or vomiting. ? Drainage other than breast milk from your nipples.  Your breasts do not become full before feedings by the fifth day after you give birth.  You feel sad and depressed.  Your baby is: ? Too sleepy to eat well. ? Having trouble sleeping. ? More than 641 week old and wetting fewer than 6 diapers in a 24-hour period. ? Not gaining weight by 665 days of age.  Your baby has fewer than 3 stools in a 24-hour period.  Your baby's skin or the white parts of his or her eyes become yellow. Get help right away if:  Your baby is overly tired (lethargic) and does not want to wake up and feed.  Your baby develops an unexplained fever. Summary  Breastfeeding offers many health benefits for infant and mothers.  Try to breastfeed your infant when he or she shows early signs of hunger.  Gently tickle or stroke your baby's lips with your finger or nipple to allow the baby to open his or her mouth.  Bring the baby to your breast. Make sure that much of the areola is in your baby's mouth. Offer one side and burp the baby before you offer the other side.  Talk with your health care provider or lactation consultant if you have questions or you face problems as you breastfeed. This information is not intended to replace advice given to you by your health care provider. Make sure you discuss any questions you have with your health care provider. Document Released: 09/02/2005 Document Revised: 11/27/2017 Document Reviewed: 10/04/2016 Elsevier Patient Education  2020 [employer]

## 2019-07-26 NOTE — Patient Instructions (Signed)

## 2019-07-27 LAB — OBSTETRIC PANEL, INCLUDING HIV
Basophils Absolute: 0 10*3/uL (ref 0.0–0.2)
Basos: 0 %
EOS (ABSOLUTE): 0.1 10*3/uL (ref 0.0–0.4)
Eos: 1 %
HIV Screen 4th Generation wRfx: NONREACTIVE
Hematocrit: 34.1 % (ref 34.0–46.6)
Hemoglobin: 11.8 g/dL (ref 11.1–15.9)
Hepatitis B Surface Ag: NEGATIVE
Immature Grans (Abs): 0 10*3/uL (ref 0.0–0.1)
Immature Granulocytes: 1 %
Lymphocytes Absolute: 1.3 10*3/uL (ref 0.7–3.1)
Lymphs: 17 %
MCH: 31.7 pg (ref 26.6–33.0)
MCHC: 34.6 g/dL (ref 31.5–35.7)
MCV: 92 fL (ref 79–97)
Monocytes Absolute: 0.5 10*3/uL (ref 0.1–0.9)
Monocytes: 6 %
Neutrophils Absolute: 5.9 10*3/uL (ref 1.4–7.0)
Neutrophils: 75 %
Platelets: 298 10*3/uL (ref 150–450)
RBC: 3.72 x10E6/uL — ABNORMAL LOW (ref 3.77–5.28)
RDW: 12.4 % (ref 11.7–15.4)
RPR Ser Ql: NONREACTIVE
Rh Factor: POSITIVE
Rubella Antibodies, IGG: 1.64 index (ref >0.99)
WBC: 7.8 10*3/uL (ref 3.4–10.8)

## 2019-07-27 LAB — HEMOGLOBIN A1C
Est. average glucose Bld gHb Est-mCnc: 103 mg/dL
Hgb A1c MFr Bld: 5.2 % (ref 4.8–5.6)

## 2019-07-27 LAB — AB SCR+ANTIBODY ID: Antibody Screen: POSITIVE — AB

## 2019-07-28 LAB — CULTURE, OB URINE

## 2019-07-28 LAB — URINE CULTURE, OB REFLEX

## 2019-07-29 ENCOUNTER — Encounter: Payer: Self-pay | Admitting: Family Medicine

## 2019-07-29 DIAGNOSIS — O361990 Maternal care for other isoimmunization, unspecified trimester, not applicable or unspecified: Secondary | ICD-10-CM | POA: Insufficient documentation

## 2019-07-29 LAB — CYTOLOGY - PAP
Chlamydia: NEGATIVE
Comment: NEGATIVE
Comment: NEGATIVE
Comment: NEGATIVE
Comment: NORMAL
Diagnosis: HIGH — AB
HPV 16: NEGATIVE
HPV 18 / 45: NEGATIVE
High risk HPV: POSITIVE — AB
Neisseria Gonorrhea: NEGATIVE

## 2019-07-30 ENCOUNTER — Encounter: Payer: Self-pay | Admitting: Family Medicine

## 2019-07-30 DIAGNOSIS — R87613 High grade squamous intraepithelial lesion on cytologic smear of cervix (HGSIL): Secondary | ICD-10-CM | POA: Insufficient documentation

## 2019-07-30 DIAGNOSIS — N879 Dysplasia of cervix uteri, unspecified: Secondary | ICD-10-CM | POA: Insufficient documentation

## 2019-07-30 NOTE — Telephone Encounter (Signed)
Patient with HSIL, +HPV pap smear on initial prenatal.  Please call and schedule her for colpo, have sent MyChart message to inform her of results but please ask her if she has seen it when you get in touch with her.

## 2019-08-02 NOTE — Telephone Encounter (Signed)
She is already scheduled

## 2019-08-06 ENCOUNTER — Encounter (HOSPITAL_COMMUNITY): Payer: Self-pay

## 2019-08-06 ENCOUNTER — Inpatient Hospital Stay (HOSPITAL_COMMUNITY)
Admission: AD | Admit: 2019-08-06 | Discharge: 2019-08-06 | Disposition: A | Discharge status: Home / Self Care | Payer: Medicaid Other | Attending: Obstetrics & Gynecology | Admitting: Obstetrics & Gynecology

## 2019-08-06 DIAGNOSIS — R519 Headache, unspecified: Secondary | ICD-10-CM | POA: Insufficient documentation

## 2019-08-06 DIAGNOSIS — Z20828 Contact with and (suspected) exposure to other viral communicable diseases: Secondary | ICD-10-CM | POA: Insufficient documentation

## 2019-08-06 DIAGNOSIS — R112 Nausea with vomiting, unspecified: Secondary | ICD-10-CM

## 2019-08-06 DIAGNOSIS — Z3A14 14 weeks gestation of pregnancy: Secondary | ICD-10-CM | POA: Insufficient documentation

## 2019-08-06 DIAGNOSIS — O26892 Other specified pregnancy related conditions, second trimester: Secondary | ICD-10-CM | POA: Diagnosis not present

## 2019-08-06 DIAGNOSIS — O219 Vomiting of pregnancy, unspecified: Secondary | ICD-10-CM | POA: Diagnosis not present

## 2019-08-06 DIAGNOSIS — Z885 Allergy status to narcotic agent status: Secondary | ICD-10-CM | POA: Insufficient documentation

## 2019-08-06 DIAGNOSIS — R197 Diarrhea, unspecified: Secondary | ICD-10-CM

## 2019-08-06 DIAGNOSIS — Z3490 Encounter for supervision of normal pregnancy, unspecified, unspecified trimester: Secondary | ICD-10-CM

## 2019-08-06 HISTORY — DX: High grade squamous intraepithelial lesion on cytologic smear of cervix (HGSIL): R87.613

## 2019-08-06 LAB — SARS CORONAVIRUS 2 (TAT 6-24 HRS): SARS Coronavirus 2: NEGATIVE

## 2019-08-06 MED ORDER — ACETAMINOPHEN 500 MG PO TABS
1000.0000 mg | ORAL_TABLET | Freq: Once | ORAL | Status: AC
  Administered 2019-08-06: 1000 mg via ORAL
  Filled 2019-08-06: qty 2

## 2019-08-06 NOTE — MAU Provider Note (Signed)
Chief Complaint: Headache, Emesis, Nausea, and Diarrhea  First Provider Initiated Contact with Patient 08/06/19 1355     SUBJECTIVE HPI: Candace Rivera is a 33 y.o. G1P0 at [redacted]w[redacted]d by who presents to maternity admissions reporting nausea, vomiting, diarrhea and headache. Reports her step-mom tested positive for COVID about 10 days ago. They have been trying to quarantine away from each other at home. Headache has been intermittent and started about 3 days ago. Vomiting and diarrhea this morning but reports eating something with sausage that did not settle well. Denies fever, SOB, cough, hematuria, hematochezia, melena. She is tolerating PO well and reports drinking a lot of fluids. No further complaints. She denies vaginal bleeding, vaginal itching/burning, urinary symptoms, dizziness or fever/chills.    Past Medical History:  Diagnosis Date  . Appendicitis   . HSIL on Pap smear of cervix    Past Surgical History:  Procedure Laterality Date  . ABSCESS DRAINAGE    . APPENDECTOMY    . CYST REMOVAL NECK  2018   Social History   Socioeconomic History  . Marital status: Single    Spouse name: Not on file  . Number of children: Not on file  . Years of education: Not on file  . Highest education level: Not on file  Occupational History  . Not on file  Social Needs  . Financial resource strain: Not on file  . Food insecurity    Worry: Never true    Inability: Never true  . Transportation needs    Medical: No    Non-medical: No  Tobacco Use  . Smoking status: Never Smoker  . Smokeless tobacco: Never Used  Substance and Sexual Activity  . Alcohol use: Not Currently    Comment: occ  . Drug use: No  . Sexual activity: Yes    Birth control/protection: None  Lifestyle  . Physical activity    Days per week: Not on file    Minutes per session: Not on file  . Stress: Not on file  Relationships  . Social Herbalist on phone: Not on file    Gets together: Not on file   Attends religious service: Not on file    Active member of club or organization: Not on file    Attends meetings of clubs or organizations: Not on file    Relationship status: Not on file  . Intimate partner violence    Fear of current or ex partner: Not on file    Emotionally abused: Not on file    Physically abused: Not on file    Forced sexual activity: Not on file  Other Topics Concern  . Not on file  Social History Narrative  . Not on file   No current facility-administered medications on file prior to encounter.    Current Outpatient Medications on File Prior to Encounter  Medication Sig Dispense Refill  . metoCLOPramide (REGLAN) 10 MG tablet Take 1 tablet (10 mg total) by mouth every 8 (eight) hours as needed for nausea. 30 tablet 0  . metroNIDAZOLE (METROGEL VAGINAL) 0.75 % vaginal gel Place 1 Applicatorful vaginally at bedtime. Insert one applicator, at bedtime, for 5 nights. (Patient not taking: Reported on 07/26/2019) 70 g 0  . Prenatal MV-Min-FA-Omega-3 (PRENATAL GUMMIES/DHA & FA PO) Take 2 tablets by mouth daily.     Allergies  Allergen Reactions  . Codeine Nausea Only  . Oxycodone Nausea Only    ROS:  Review of Systems All other systems negative unless noted  above in HPI.   I have reviewed patient's Past Medical Hx, Surgical Hx, Family Hx, Social Hx, medications and allergies.   Physical Exam   Patient Vitals for the past 24 hrs:  BP Temp Temp src Pulse Resp SpO2 Height Weight  08/06/19 1343 111/67 98.6 F (37 C) Oral 83 18 100 % 5\' 4"  (1.626 m) 44.9 kg   Constitutional: Well-developed, well-nourished female in no acute distress.  Cardiovascular: normal rate Respiratory: normal effort GI: Abd soft, mild suprapubic tenderness, no rebound or guarding MS: Extremities nontender, no edema, normal ROM Neurologic: Alert and oriented x 4.  GU: Neg CVAT. PELVIC EXAM: deferred  FHT 164 by doppler  LAB RESULTS No results found for this or any previous visit  (from the past 24 hour(s)).  A/Positive/-- (11/09 1236)  IMAGING No results found.  MAU Management/MDM: Orders Placed This Encounter  Procedures  . SARS CORONAVIRUS 2 (TAT 6-24 HRS) Nasopharyngeal Nasopharyngeal Swab  . Discharge patient Discharge disposition: 01-Home or Self Care; Discharge patient date: 08/06/2019    Meds ordered this encounter  Medications  . acetaminophen (TYLENOL) tablet 1,000 mg    ASSESSMENT 1. Acute nonintractable headache, unspecified headache type   2. Encounter for supervision of low-risk pregnancy, antepartum   3. Diarrhea, unspecified type   4. Non-intractable vomiting with nausea, unspecified vomiting type   Patient presents to MAU with concern for COVID symptoms. Patient overall well-appearing. Vitals stable. No cough. Patient mainly desires COVID testing. No indication for IVF due to stable vitals and patient reports she is tolerating PO well. Tylenol given for headache which completely resolved headache. Note provided for work and advised patient to quarantine until test results return and symptoms resolve. F/u as scheduled.  Pt discharged with strict return precautions.  PLAN Discharge home Allergies as of 08/06/2019      Reactions   Codeine Nausea Only   Oxycodone Nausea Only      Medication List    STOP taking these medications   metroNIDAZOLE 0.75 % vaginal gel Commonly known as: METROGEL VAGINAL     TAKE these medications   metoCLOPramide 10 MG tablet Commonly known as: REGLAN Take 1 tablet (10 mg total) by mouth every 8 (eight) hours as needed for nausea.   PRENATAL GUMMIES/DHA & FA PO Take 2 tablets by mouth daily.      Follow-up Information    [facility] Follow up.   Specialty: Obstetrics and Gynecology Contact information: [address] [place] [address], [address] [place] [phone]          [phone], MD [facility] Family Medicine Fellow,  [facility] for [facility], [facility] 08/06/2019  3:04 PM

## 2019-08-06 NOTE — MAU Note (Signed)
PT is G1P0 at [redacted]w[redacted]D, pt began having a headache, which she thought was from dehydration or not eating enough.  Today she began having diarrhea and vomiting. Denies fever or any respiratory symptoms.  She was exposed to a family member who is Covid +.

## 2019-08-06 NOTE — Discharge Instructions (Signed)
Please quarantine until you receive test results. Okay to take 1000 mg Tylenol every 8 hours as needed for headache. Please focus on drinking plenty of fluids.

## 2019-08-09 ENCOUNTER — Encounter: Payer: Self-pay | Admitting: *Deleted

## 2019-08-16 ENCOUNTER — Encounter: Payer: Self-pay | Admitting: *Deleted

## 2019-08-23 ENCOUNTER — Ambulatory Visit (INDEPENDENT_AMBULATORY_CARE_PROVIDER_SITE_OTHER): Payer: Medicaid Other | Admitting: Family Medicine

## 2019-08-23 ENCOUNTER — Other Ambulatory Visit (HOSPITAL_COMMUNITY)
Admission: RE | Admit: 2019-08-23 | Discharge: 2019-08-23 | Disposition: A | Discharge status: Home / Self Care | Payer: Medicaid Other | Source: Ambulatory Visit | Attending: Family Medicine | Admitting: Family Medicine

## 2019-08-23 ENCOUNTER — Other Ambulatory Visit: Payer: Self-pay

## 2019-08-23 VITALS — BP 108/68 | HR 79 | Wt 102.9 lb

## 2019-08-23 DIAGNOSIS — D069 Carcinoma in situ of cervix, unspecified: Secondary | ICD-10-CM

## 2019-08-23 DIAGNOSIS — R87613 High grade squamous intraepithelial lesion on cytologic smear of cervix (HGSIL): Secondary | ICD-10-CM | POA: Diagnosis not present

## 2019-08-23 DIAGNOSIS — Z3490 Encounter for supervision of normal pregnancy, unspecified, unspecified trimester: Secondary | ICD-10-CM

## 2019-08-23 MED ORDER — BLOOD PRESSURE KIT DEVI: 1.0000 | Freq: Once | 0 refills | Status: AC

## 2019-08-23 NOTE — Addendum Note (Signed)
Addended by: Louisa Second E on: 08/23/2019 11:43 AM   Modules accepted: Orders

## 2019-08-23 NOTE — Progress Notes (Signed)
    GYNECOLOGY CLINIC COLPOSCOPY PROCEDURE NOTE  33 y.o. G1P0 here for colposcopy for high-grade squamous intraepithelial neoplasia  (HGSIL-encompassing moderate and severe dysplasia) with high-risk HPV pap smear on 07/26/2019. Discussed role for HPV in cervical dysplasia, need for surveillance.  Patient given informed consent, signed copy in the chart, time out was performed.  Placed in lithotomy position. Cervix viewed with speculum and colposcope after application of acetic acid.   Colposcopy adequate? Yes Aceto-white changes noted at 5 o'clock; corresponding biopsy obtained.  ECC specimen not obtained due to current pregnancy. All specimens were labeled and sent to pathology.  Patient was given post procedure instructions.  Will follow up pathology and manage accordingly; patient will be contacted with results and recommendations.    Barrington Ellison, MD [facility] Family Medicine Fellow, [facility] for [employer], [place]

## 2019-08-25 ENCOUNTER — Encounter: Payer: Self-pay | Admitting: Medical

## 2019-08-25 ENCOUNTER — Telehealth (INDEPENDENT_AMBULATORY_CARE_PROVIDER_SITE_OTHER): Payer: Medicaid Other | Admitting: Medical

## 2019-08-25 ENCOUNTER — Other Ambulatory Visit: Payer: Self-pay

## 2019-08-25 DIAGNOSIS — Z3492 Encounter for supervision of normal pregnancy, unspecified, second trimester: Secondary | ICD-10-CM

## 2019-08-25 DIAGNOSIS — R87613 High grade squamous intraepithelial lesion on cytologic smear of cervix (HGSIL): Secondary | ICD-10-CM

## 2019-08-25 DIAGNOSIS — Z3A17 17 weeks gestation of pregnancy: Secondary | ICD-10-CM

## 2019-08-25 LAB — SURGICAL PATHOLOGY

## 2019-08-25 NOTE — Progress Notes (Signed)
I connected with  [facility] on 08/25/19 at  8:55 AM EST by telephone and verified that I am speaking with the correct person using two identifiers.   I discussed the limitations, risks, security and privacy concerns of performing an evaluation and management service by telephone and the availability of in person appointments. I also discussed with the patient that there may be a patient responsible charge related to this service. The patient expressed understanding and agreed to proceed.  Bethanne Ginger, [place] 08/25/2019  9:16 AM

## 2019-08-25 NOTE — Progress Notes (Signed)
I connected with [facility]  on 08/25/19 at  8:55 AM EST by: MyChart and verified that I am speaking with the correct person using two identifiers.  Patient is located at home and provider is located at [facility].     The purpose of this virtual visit is to provide medical care while limiting exposure to the novel coronavirus. I discussed the limitations, risks, security and privacy concerns of performing an evaluation and management service by MyChart and the availability of in person appointments. I also discussed with the patient that there may be a patient responsible charge related to this service. By engaging in this virtual visit, you consent to the provision of healthcare.  Additionally, you authorize for your insurance to be billed for the services provided during this visit.  The patient expressed understanding and agreed to proceed.  The following staff members participated in the virtual visit:  Carver Fila, [place] VISIT NOTE  Subjective:  Candace Rivera is a 33 y.o. G1P0 at [redacted]w[redacted]d  for phone visit for ongoing prenatal care.  She is currently monitored for the following issues for this low-risk pregnancy and has Adjustment disorder with mixed disturbance of emotions and conduct; Self-inflicted laceration of wrist ([place]); Supervision of low-risk pregnancy; Anti-Lewis a Ab; and HSIL (high grade squamous intraepithelial lesion) on Pap smear of cervix on their problem list.  Patient reports itching on face and back.  Contractions: Not present. Vag. Bleeding: Scant.  Movement: Present. Denies leaking of fluid.   The following portions of the patient's history were reviewed and updated as appropriate: allergies, current medications, past family history, past medical history, past social history, past surgical history and problem list.   Objective:  There were no vitals filed for this visit. Self-Obtained  Fetal Status:     Movement: Present     Assessment and Plan:  Pregnancy:  G1P0 at [redacted]w[redacted]d 1. Encounter for supervision of low-risk pregnancy in second trimester - Doing well - Anatomy US scheduled 12/21 - Will have AFP drawn at that visit  - Also adding bile acids and CMP at that visit for itching - Still unsure of MOC and Peds, will continue to research  2. HSIL (high grade squamous intraepithelial lesion) on Pap smear of cervix - Had colpo on Monday, results pending   Preterm labor symptoms and general obstetric precautions including but not limited to vaginal bleeding, contractions, leaking of fluid and fetal movement were reviewed in detail with the patient.  Return in about 4 weeks (around 09/22/2019) for LOB, Virtual.  Future Appointments  Date Time Provider Justice  09/06/2019  9:30 AM WH-MFC [place] 1 WH-MFCUS MFC-US     Time spent on virtual visit: 10 minutes  Kerry Hough, PA-C

## 2019-08-25 NOTE — Patient Instructions (Signed)

## 2019-08-30 ENCOUNTER — Encounter: Payer: Self-pay | Admitting: General Practice

## 2019-09-06 ENCOUNTER — Other Ambulatory Visit: Payer: Self-pay

## 2019-09-06 ENCOUNTER — Ambulatory Visit (HOSPITAL_COMMUNITY)
Admission: RE | Admit: 2019-09-06 | Discharge: 2019-09-06 | Disposition: A | Discharge status: Home / Self Care | Payer: Medicaid Other | Source: Ambulatory Visit | Attending: Obstetrics and Gynecology | Admitting: Obstetrics and Gynecology

## 2019-09-06 DIAGNOSIS — Z3490 Encounter for supervision of normal pregnancy, unspecified, unspecified trimester: Secondary | ICD-10-CM | POA: Diagnosis present

## 2019-09-06 DIAGNOSIS — Z3A19 19 weeks gestation of pregnancy: Secondary | ICD-10-CM

## 2019-09-06 DIAGNOSIS — Z363 Encounter for antenatal screening for malformations: Secondary | ICD-10-CM

## 2019-09-07 ENCOUNTER — Other Ambulatory Visit: Payer: Self-pay

## 2019-09-07 ENCOUNTER — Inpatient Hospital Stay (HOSPITAL_COMMUNITY)
Admission: AD | Admit: 2019-09-07 | Discharge: 2019-09-07 | Disposition: A | Discharge status: Home / Self Care | Payer: Medicaid Other | Attending: Obstetrics and Gynecology | Admitting: Obstetrics and Gynecology

## 2019-09-07 ENCOUNTER — Encounter (HOSPITAL_COMMUNITY): Payer: Self-pay | Admitting: Obstetrics and Gynecology

## 2019-09-07 DIAGNOSIS — Z885 Allergy status to narcotic agent status: Secondary | ICD-10-CM | POA: Insufficient documentation

## 2019-09-07 DIAGNOSIS — Z833 Family history of diabetes mellitus: Secondary | ICD-10-CM | POA: Diagnosis not present

## 2019-09-07 DIAGNOSIS — N949 Unspecified condition associated with female genital organs and menstrual cycle: Secondary | ICD-10-CM | POA: Diagnosis not present

## 2019-09-07 DIAGNOSIS — R102 Pelvic and perineal pain: Secondary | ICD-10-CM | POA: Diagnosis not present

## 2019-09-07 DIAGNOSIS — O99891 Other specified diseases and conditions complicating pregnancy: Secondary | ICD-10-CM | POA: Diagnosis present

## 2019-09-07 DIAGNOSIS — Z3A19 19 weeks gestation of pregnancy: Secondary | ICD-10-CM | POA: Insufficient documentation

## 2019-09-07 DIAGNOSIS — O26892 Other specified pregnancy related conditions, second trimester: Secondary | ICD-10-CM | POA: Diagnosis not present

## 2019-09-07 DIAGNOSIS — Z3492 Encounter for supervision of normal pregnancy, unspecified, second trimester: Secondary | ICD-10-CM

## 2019-09-07 LAB — URINALYSIS, ROUTINE W REFLEX MICROSCOPIC
Bilirubin Urine: NEGATIVE
Glucose, UA: 50 mg/dL — AB
Hgb urine dipstick: NEGATIVE
Ketones, ur: NEGATIVE mg/dL
Nitrite: NEGATIVE
Protein, ur: NEGATIVE mg/dL
Specific Gravity, Urine: 1.018 (ref 1.005–1.030)
pH: 6 (ref 5.0–8.0)

## 2019-09-07 MED ORDER — COMFORT FIT MATERNITY SUPP MED MISC: 1.0000 [IU] | Freq: Every day | 0 refills | Status: DC

## 2019-09-07 MED ORDER — ACETAMINOPHEN 500 MG PO TABS
1000.0000 mg | ORAL_TABLET | Freq: Once | ORAL | Status: AC
  Administered 2019-09-07: 1000 mg via ORAL
  Filled 2019-09-07: qty 2

## 2019-09-07 NOTE — MAU Provider Note (Signed)
History     CSN: [id number]  Arrival date and time: 09/07/19 1224   First Provider Initiated Contact with Patient 09/07/19 1312      Chief Complaint  Patient presents with  . Abdominal Pain   HPI Ms. Candace Rivera is a 33 y.o. G1P0 at [redacted]w[redacted]d who presents to MAU today with complaint of LLQ pain since last night. She felt it was worse this morning. She rates her pain at 7/10. She has not taken anything for pain today. She feels pain is worse with change or positions and while active at work. 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 She denies vaginal bleeding, discharge, UTI symptoms, N/V/D or constipation.   OB History    Gravida  1   Para      Term      Preterm      AB      Living        SAB      TAB      Ectopic      Multiple      Live Births              Past Medical History:  Diagnosis Date  . Appendicitis   . HSIL on Pap smear of cervix     Past Surgical History:  Procedure Laterality Date  . ABSCESS DRAINAGE    . APPENDECTOMY    . CYST REMOVAL NECK  2018    Family History  Problem Relation Age of Onset  . Heart disease Father   . Hypertension Father   . Diabetes Father     Social History   Tobacco Use  . Smoking status: Never Smoker  . Smokeless tobacco: Never Used  Substance Use Topics  . Alcohol use: Not Currently    Comment: occ  . Drug use: No    Allergies:  Allergies  Allergen Reactions  . Codeine Nausea Only  . Oxycodone Nausea Only    No medications prior to admission.    Review of Systems  Constitutional: Negative for fever.  Gastrointestinal: Positive for abdominal pain. Negative for constipation, diarrhea, nausea and vomiting.  Genitourinary: Negative for dysuria, frequency, urgency, vaginal bleeding and vaginal discharge.   Physical Exam   Blood pressure 122/77, pulse 85, temperature 98.1 F (36.7 C), resp. rate 16, height 5\' 4"  (1.626 m), weight 46.9 kg, last menstrual period  04/26/2019, SpO2 100 %.  Physical Exam  Nursing note and vitals reviewed. Constitutional: She is oriented to person, place, and time. She appears well-developed and well-nourished. No distress.  HENT:  Head: Normocephalic and atraumatic.  Cardiovascular: Normal rate.  Respiratory: Effort normal.  GI: Soft. She exhibits no distension and no mass. There is abdominal tenderness (mild diffuse tenderness to palpation worse in LLQ ). There is no rebound and no guarding.  Neurological: She is alert and oriented to person, place, and time.  Skin: Skin is warm and dry. No erythema.  Psychiatric: She has a normal mood and affect.  Dilation: Closed Effacement (%): Thick Cervical Position: Middle Exam by:: Kerry Hough, PA-C   Results for orders placed or performed during the hospital encounter of 09/07/19 (from the past 24 hour(s))  Urinalysis, Routine w reflex microscopic     Status: Abnormal   Collection Time: 09/07/19 12:48 PM  Result Value Ref Range   Color, Urine YELLOW YELLOW   APPearance CLOUDY (A) CLEAR   Specific Gravity, Urine 1.018 1.005 - 1.030   pH 6.0  5.0 - 8.0   Glucose, UA 50 (A) NEGATIVE mg/dL   Hgb urine dipstick NEGATIVE NEGATIVE   Bilirubin Urine NEGATIVE NEGATIVE   Ketones, ur NEGATIVE NEGATIVE mg/dL   Protein, ur NEGATIVE NEGATIVE mg/dL   Nitrite NEGATIVE NEGATIVE   Leukocytes,Ua SMALL (A) NEGATIVE   RBC / HPF 0-5 0 - 5 RBC/hpf   WBC, UA 0-5 0 - 5 WBC/hpf   Bacteria, UA RARE (A) NONE SEEN   Squamous Epithelial / LPF 0-5 0 - 5   Mucus PRESENT      MAU Course  Procedures None   MDM FHR - 157 bpm with doppler  UA, Wet prep, GC/Chlamydia today  Tylenol 1 G given  Urine culture sent  Assessment and Plan  A: SIUP at [redacted]w[redacted]d Round ligament pain   P:  Discharge home Rx for abdominal binder given to patient with information for [employer] Tylenol PRN for pain advised Discussed moderation of activity and hydrotherapy  Warning signs for worsening condition  discussed Patient advised to follow-up with [facility] as scheduled or sooner PRN  Patient may return to MAU as needed or if her condition were to change or worsen  Vonzella Nipple, PA-C 09/07/2019, 3:03 PM

## 2019-09-07 NOTE — MAU Note (Signed)
Presents with right lower abdominal pain and pelvic pressure. Denies VB or LOF.

## 2019-09-07 NOTE — Discharge Instructions (Signed)
[facility]  ° °[address], [place], [place] [address] °Phone: [phone] ° °Monday     8:30AM-5PM °Tuesday 8:30AM-5PM °Wednesday 8:30AM-5PM °Thursday 8:30AM-5PM °Friday  8:30AM-5PM °Saturday Closed °Sunday Closed ° ° ° °Round Ligament Pain ° °The round ligament is a cord of muscle and tissue that helps support the uterus. It can become a source of pain during pregnancy if it becomes stretched or twisted as the baby grows. The pain usually begins in the second trimester (13-28 weeks) of pregnancy, and it can come and go until the baby is delivered. It is not a serious problem, and it does not cause harm to the baby. °Round ligament pain is usually a short, sharp, and pinching pain, but it can also be a dull, lingering, and aching pain. The pain is felt in the lower side of the abdomen or in the groin. It usually starts deep in the groin and moves up to the outside of the hip area. The pain may occur when you: °· Suddenly change position, such as quickly going from a sitting to standing position. °· Roll over in bed. °· Cough or sneeze. °· Do physical activity. °Follow these instructions at home: ° °· Watch your condition for any changes. °· When the pain starts, relax. Then try any of these methods to help with the pain: °? Sitting down. °? Flexing your knees up to your abdomen. °? Lying on your side with one pillow under your abdomen and another pillow between your legs. °? Sitting in a warm bath for 15-20 minutes or until the pain goes away. °· Take over-the-counter and prescription medicines only as told by your health care provider. °· Move slowly when you sit down or stand up. °· Avoid long walks if they cause pain. °· Stop or reduce your physical activities if they cause pain. °· Keep all follow-up visits as told by your health care provider. This is important. °Contact a health care provider if: °· Your pain does not go away with treatment. °· You feel pain in your back that you did  not have before. °· Your medicine is not helping. °Get help right away if: °· You have a fever or chills. °· You develop uterine contractions. °· You have vaginal bleeding. °· You have nausea or vomiting. °· You have diarrhea. °· You have pain when you urinate. °Summary °· Round ligament pain is felt in the lower abdomen or groin. It is usually a short, sharp, and pinching pain. It can also be a dull, lingering, and aching pain. °· This pain usually begins in the second trimester (13-28 weeks). It occurs because the uterus is stretching with the growing baby, and it is not harmful to the baby. °· You may notice the pain when you suddenly change position, when you cough or sneeze, or during physical activity. °· Relaxing, flexing your knees to your abdomen, lying on one side, or taking a warm bath may help to get rid of the pain. °· Get help from your health care provider if the pain does not go away or if you have vaginal bleeding, nausea, vomiting, diarrhea, or painful urination. °This information is not intended to replace advice given to you by your health care provider. Make sure you discuss any questions you have with your health care provider. °Document Released: 06/11/2008 Document Revised: 02/18/2018 Document Reviewed: 02/18/2018 °Elsevier Patient Education © 2020 Elsevier Inc. ° °

## 2019-09-08 LAB — CULTURE, OB URINE: Culture: NO GROWTH

## 2019-09-08 LAB — GC/CHLAMYDIA PROBE AMP (~~LOC~~) NOT AT ARMC
Chlamydia: NEGATIVE
Comment: NEGATIVE
Comment: NORMAL
Neisseria Gonorrhea: NEGATIVE

## 2019-09-17 NOTE — L&D Delivery Note (Addendum)
OB/GYN Faculty Practice Delivery Note  Candace Rivera is a 34 y.o. G1P0 s/p VAVD at [redacted]w[redacted]d. She was admitted for IOL for gHTN.   ROM: 11h 58m with clear fluid GBS Status: Negative/-- (04/20 1120) Maximum Maternal Temperature: 99.13F  Labor Progress: . Initial SVE: 1/60/-2. Patient received Cytotec, Foley balloon and Pitocin. SROm occurred. Received epidural. She then progressed to complete. Patient was complete for almost 4 hours and pushed for about 2.5 hours with good effort but little descent. Mother reported exhaustion and desired assistance.   Delivery Date/Time: 5/2 @ 1459 Delivery:  Indication for operative vaginal delivery: Pushing > 2 hours with little descent despite multiple position changes and good effort; maternal exhaustion.  Patient was examined and found to be fully dilated with fetal station of +2.  Patient's bladder was noted to be empty, and there were no known fetal contraindications to operative vaginal delivery. EFW was 3200g by Leopolds/recent ultrasound.  FHR tracing Cat I prior to vacuum application.   Risks of vacuum assistance were discussed in detail, including but not limited to, bleeding, infection, damage to maternal tissues, fetal cephalohematoma, inability to effect vaginal delivery of the head or shoulder dystocia that cannot be resolved by established maneuvers and need for emergency cesarean section.  Patient gave verbal consent.  The soft Kiwi vacuum cup was positioned over the sagittal suture 3 cm anterior to posterior fontanelle.  Pressure was then increased to 500 mmHg, and the patient was instructed to push.  Pulling was administered along the pelvic curve while patient was pushing; there were 6 contractions and 1 popoff.  Vacuum was reduced in between contractions. Head delivered OA and restituted to ROA. No nuchal cord present but body cord present over shoulders. Shoulder and body delivered in usual fashion. Infant placed on mother's abdomen and low  heart rate noted along with poor tone, cord cut and clamped x2 and handed off to NICU team. Arterial cord pH drawn. Cord blood drawn. Placenta delivered spontaneously with gentle cord traction. Fundus firm with massage and Pitocin. Labia, perineum, vagina, and cervix inspected inspected with 3c laceration which was repaired with 2-0 and 3-0 Vicryl in a standard fashion. Dr. Debroah Loop present for delivery and repair.    Baby Weight: 3150g Arterial cord pH: 7.19  Placenta: Sent to L&D Complications: Vacuum--assisted and 3c perineal tear Lacerations: 3c perineal laceration EBL: 120 mL Analgesia: Epidural   Infant:  APGAR (1 MIN): 7   APGAR (5 MINS): 9   APGAR (10 MINS):     Jerilynn Birkenhead, MD OB Family Medicine Fellow, [facility] for [facility], [facility] 01/16/2020, 3:34 PM

## 2019-09-23 ENCOUNTER — Other Ambulatory Visit: Payer: Self-pay

## 2019-09-23 ENCOUNTER — Encounter: Payer: Self-pay | Admitting: Medical

## 2019-09-23 ENCOUNTER — Telehealth: Payer: Self-pay | Admitting: Medical

## 2019-09-23 ENCOUNTER — Encounter: Payer: Medicaid Other | Admitting: Medical

## 2019-09-23 NOTE — Patient Instructions (Signed)
Please reschedule your missed appointment as soon as possible 

## 2019-09-23 NOTE — Progress Notes (Signed)
Attempted to call pt at 1316; VM left stating I am calling to check pt in for virtual appt and will call back in 15 minutes.  Attempted to call pt at 1335; VM left stating pt will need to reschedule appt. Call back number given.   Fleet Contras RN 09/23/19

## 2019-09-23 NOTE — Telephone Encounter (Signed)
Attempted to contact patient to get her rescheduled for her missed ob appointment. No answer left voicemail for patient to give the office a call back to be rescheduled. No show letter mailed

## 2019-10-11 ENCOUNTER — Other Ambulatory Visit: Payer: Self-pay

## 2019-10-11 ENCOUNTER — Ambulatory Visit (INDEPENDENT_AMBULATORY_CARE_PROVIDER_SITE_OTHER): Payer: Medicaid Other | Admitting: Family Medicine

## 2019-10-11 VITALS — BP 122/79 | HR 92 | Wt 118.5 lb

## 2019-10-11 DIAGNOSIS — L299 Pruritus, unspecified: Secondary | ICD-10-CM

## 2019-10-11 DIAGNOSIS — R87613 High grade squamous intraepithelial lesion on cytologic smear of cervix (HGSIL): Secondary | ICD-10-CM

## 2019-10-11 DIAGNOSIS — Z3A24 24 weeks gestation of pregnancy: Secondary | ICD-10-CM

## 2019-10-11 DIAGNOSIS — Z3490 Encounter for supervision of normal pregnancy, unspecified, unspecified trimester: Secondary | ICD-10-CM

## 2019-10-11 DIAGNOSIS — O26892 Other specified pregnancy related conditions, second trimester: Secondary | ICD-10-CM

## 2019-10-11 MED ORDER — AMBULATORY NON FORMULARY MEDICATION: 1.0000 | 0 refills | Status: DC

## 2019-10-11 NOTE — Progress Notes (Signed)
Subjective:  Candace Rivera is a 34 y.o. G1P0 at 17w0dbeing seen today for ongoing prenatal care.  She is currently monitored for the following issues for this low-risk pregnancy and has Adjustment disorder with mixed disturbance of emotions and conduct; Self-[place] of [place]; Supervision of low-risk pregnancy; Anti-Lewis a Ab; and HSIL (high grade squamous intraepithelial lesion) on Pap smear of cervix on their problem list.  Patient reports itching since the beginning of pregnancy, it has gotten worse in the last two weeks, but is mainly on her back and abdomen.  Contractions: Not present. Vag. Bleeding: None.  Movement: Present. Denies leaking of fluid.   The following portions of the patient's history were reviewed and updated as appropriate: allergies, current medications, past family history, past medical history, past social history, past surgical history and problem list. Problem list updated.  Objective:   Vitals:   10/11/19 1439  BP: 122/79  Pulse: 92  Weight: 118 lb 8 oz (53.8 kg)    Fetal Status:   Fundal Height: 24 cm Movement: Present     General:  Alert, oriented and cooperative. Patient is in no acute distress.  Skin: Skin is warm and dry. No rash noted.   Cardiovascular: Normal heart rate noted  Respiratory: Normal respiratory effort, no problems with respiration noted  Abdomen: Soft, gravid, appropriate for gestational age. Pain/Pressure: Absent     Pelvic: Vag. Bleeding: None     Cervical exam deferred        Extremities: Normal range of motion.  Edema: None  Mental Status: Normal mood and affect. Normal behavior. Normal judgment and thought content.    Assessment and Plan:  Pregnancy: G1P0 at 237w0d1. Encounter for supervision of low-risk pregnancy, antepartum - Continue routine prenatal care - Anatomy was normal  2. Itching - Bile acids, total - Comp Met (CMET)  3. HSIL (high grade squamous intraepithelial lesion) on Pap smear of  cervix - Needs pp LEEP   Preterm labor symptoms and general obstetric precautions including but not limited to vaginal bleeding, contractions, leaking of fluid and fetal movement were reviewed in detail with the patient. Please refer to After Visit Summary for other counseling recommendations.  Return in about 4 weeks (around 11/08/2019) for ROB, 2 hour GTT, in person.   Endia Moncur L, DO

## 2019-10-11 NOTE — Progress Notes (Signed)
Pt states she has been having severe itching on her back and abdomen x2 weeks. Pt states she has not been sleeping due to increased fetal movement.  Pt still has not received her BP cuff - called [facility] and they have it ready for pick up. Pt informed. Marland Kitchen

## 2019-10-11 NOTE — Patient Instructions (Signed)
Hormonal Contraception Information Hormonal contraception is a type of birth control that uses hormones to prevent pregnancy. It usually involves a combination of the hormones estrogen and progesterone or only the hormone progesterone. Hormonal contraception works in these ways:  It thickens the mucus in the cervix, making it harder for sperm to enter the uterus.  It changes the lining of the uterus, making it harder for an egg to implant.  It may stop the ovaries from releasing eggs (ovulation). Some women who take hormonal contraceptives that contain only progesterone may continue to ovulate. Hormonal contraception cannot prevent sexually transmitted infections (STIs). Pregnancy may still occur. Estrogen and progesterone contraceptives Contraceptives that use a combination of estrogen and progesterone are available in these forms:  Pill. Pills come in different combinations of hormones. They must be taken at the same time each day. Pills can affect your period, causing you to get your period once every three months or not at all.  Patch. The patch must be worn on the lower abdomen for three weeks and then removed on the fourth.  Vaginal ring. The ring is placed in the vagina and left there for three weeks. It is then removed for one week. Progesterone contraceptives Contraceptives that use progesterone only are available in these forms:  Pill. Pills should be taken every day of the cycle.  Intrauterine device (IUD). This device is inserted into the uterus and removed or replaced every five years or sooner.  Implant. Plastic rods are placed under the skin of the upper arm. They are removed or replaced every three years or sooner.  Injection. The injection is given once every 90 days. What are the side effects? The side effects of estrogen and progesterone contraceptives include:  Nausea.  Headaches.  Breast tenderness.  Bleeding or spotting between menstrual cycles.  High blood  pressure (rare).  Strokes, heart attacks, or blood clots (rare) Side effects of progesterone-only contraceptives include:  Nausea.  Headaches.  Breast tenderness.  Unpredictable menstrual bleeding.  High blood pressure (rare). Talk to your health care provider about what side effects may affect you. Where to find more information  Ask your health care provider for more information and resources about hormonal contraception.  [place] Department of Health and Human Services Office on Women's Health: www.womenshealth.gov Questions to ask:  What type of hormonal contraception is right for me?  How long should I plan to use hormonal contraception?  What are the side effects of the hormonal contraception method I choose?  How can I prevent STIs while using hormonal contraception? Contact a health care provider if:  You start taking hormonal contraceptives and you develop persistent or severe side effects. Summary  Estrogen and progesterone are hormones used in many forms of birth control.  Talk to your health care provider about what side effects may affect you.  Hormonal contraception cannot prevent sexually transmitted infections (STIs).  Ask your health care provider for more information and resources about hormonal contraception. This information is not intended to replace advice given to you by your health care provider. Make sure you discuss any questions you have with your health care provider. Document Revised: 12/28/2018 Document Reviewed: 08/02/2016 Elsevier Patient Education  2020 [employer] Inc.  

## 2019-10-12 ENCOUNTER — Telehealth: Payer: Self-pay

## 2019-10-12 LAB — COMPREHENSIVE METABOLIC PANEL
ALT: 53 IU/L — ABNORMAL HIGH (ref 0–32)
AST: 49 IU/L — ABNORMAL HIGH (ref 0–40)
Albumin/Globulin Ratio: 1.3 (ref 1.2–2.2)
Albumin: 3.4 g/dL — ABNORMAL LOW (ref 3.8–4.8)
Alkaline Phosphatase: 69 IU/L (ref 39–117)
BUN/Creatinine Ratio: 10 (ref 9–23)
BUN: 5 mg/dL — ABNORMAL LOW (ref 6–20)
Bilirubin Total: <0.2 mg/dL (ref 0.0–1.2)
CO2: 20 mmol/L (ref 20–29)
Calcium: 8.8 mg/dL (ref 8.7–10.2)
Chloride: 103 mmol/L (ref 96–106)
Creatinine, Ser: 0.5 mg/dL — ABNORMAL LOW (ref 0.57–1.00)
GFR calc Af Amer: 149 mL/min/{1.73_m2} (ref >59)
GFR calc non Af Amer: 129 mL/min/{1.73_m2} (ref >59)
Globulin, Total: 2.6 g/dL (ref 1.5–4.5)
Glucose: 76 mg/dL (ref 65–99)
Potassium: 3.8 mmol/L (ref 3.5–5.2)
Sodium: 137 mmol/L (ref 134–144)
Total Protein: 6 g/dL (ref 6.0–8.5)

## 2019-10-12 LAB — BILE ACIDS, TOTAL: Bile Acids Total: 4 umol/L (ref 0.0–10.0)

## 2019-10-12 NOTE — Telephone Encounter (Addendum)
[employer] called office with elevated BP alert of 122/90. Called pt. Pt states she checked her BP last night and logged in [employer]. Pt just woke up this morning and has not taken BP. Denies any symptoms at time of BP check. Requested that pt check BP when she gets up and log into [employer]. Pt reports feeling lightheaded; has not eaten. Encouraged pt to eat breakfast and drink water. Explained that if lightheadedness does not improve she should call the office. Pt verbalizes understanding.  Per [employer] review pt rechecked BP at 1031: 119/70.

## 2019-10-21 ENCOUNTER — Encounter: Payer: Self-pay | Admitting: *Deleted

## 2019-11-02 ENCOUNTER — Inpatient Hospital Stay (HOSPITAL_COMMUNITY)
Admission: AD | Admit: 2019-11-02 | Discharge: 2019-11-02 | Disposition: A | Discharge status: Home / Self Care | Payer: Medicaid Other | Attending: Obstetrics and Gynecology | Admitting: Obstetrics and Gynecology

## 2019-11-02 ENCOUNTER — Other Ambulatory Visit: Payer: Self-pay

## 2019-11-02 ENCOUNTER — Encounter (HOSPITAL_COMMUNITY): Payer: Self-pay | Admitting: Obstetrics and Gynecology

## 2019-11-02 DIAGNOSIS — E46 Unspecified protein-calorie malnutrition: Secondary | ICD-10-CM

## 2019-11-02 DIAGNOSIS — O26892 Other specified pregnancy related conditions, second trimester: Secondary | ICD-10-CM

## 2019-11-02 DIAGNOSIS — O2512 Malnutrition in pregnancy, second trimester: Secondary | ICD-10-CM | POA: Diagnosis not present

## 2019-11-02 DIAGNOSIS — Z3A27 27 weeks gestation of pregnancy: Secondary | ICD-10-CM | POA: Diagnosis not present

## 2019-11-02 DIAGNOSIS — Z3492 Encounter for supervision of normal pregnancy, unspecified, second trimester: Secondary | ICD-10-CM

## 2019-11-02 DIAGNOSIS — E8809 Other disorders of plasma-protein metabolism, not elsewhere classified: Secondary | ICD-10-CM | POA: Insufficient documentation

## 2019-11-02 LAB — URINALYSIS, ROUTINE W REFLEX MICROSCOPIC
Bilirubin Urine: NEGATIVE
Glucose, UA: NEGATIVE mg/dL
Hgb urine dipstick: NEGATIVE
Ketones, ur: NEGATIVE mg/dL
Nitrite: NEGATIVE
Protein, ur: NEGATIVE mg/dL
Specific Gravity, Urine: 1.009 (ref 1.005–1.030)
pH: 8 (ref 5.0–8.0)

## 2019-11-02 LAB — COMPREHENSIVE METABOLIC PANEL
ALT: 32 U/L (ref 0–44)
AST: 30 U/L (ref 15–41)
Albumin: 2.7 g/dL — ABNORMAL LOW (ref 3.5–5.0)
Alkaline Phosphatase: 73 U/L (ref 38–126)
Anion gap: 7 (ref 5–15)
BUN: <5 mg/dL — ABNORMAL LOW (ref 6–20)
CO2: 24 mmol/L (ref 22–32)
Calcium: 8.8 mg/dL — ABNORMAL LOW (ref 8.9–10.3)
Chloride: 107 mmol/L (ref 98–111)
Creatinine, Ser: 0.5 mg/dL (ref 0.44–1.00)
GFR calc Af Amer: >60 mL/min (ref >60)
GFR calc non Af Amer: >60 mL/min (ref >60)
Glucose, Bld: 75 mg/dL (ref 70–99)
Potassium: 3.5 mmol/L (ref 3.5–5.1)
Sodium: 138 mmol/L (ref 135–145)
Total Bilirubin: 0.6 mg/dL (ref 0.3–1.2)
Total Protein: 6.1 g/dL — ABNORMAL LOW (ref 6.5–8.1)

## 2019-11-02 LAB — CBC
HCT: 33.4 % — ABNORMAL LOW (ref 36.0–46.0)
Hemoglobin: 11.2 g/dL — ABNORMAL LOW (ref 12.0–15.0)
MCH: 31.5 pg (ref 26.0–34.0)
MCHC: 33.5 g/dL (ref 30.0–36.0)
MCV: 93.8 fL (ref 80.0–100.0)
Platelets: 296 10*3/uL (ref 150–400)
RBC: 3.56 MIL/uL — ABNORMAL LOW (ref 3.87–5.11)
RDW: 12.7 % (ref 11.5–15.5)
WBC: 13.6 10*3/uL — ABNORMAL HIGH (ref 4.0–10.5)
nRBC: 0 % (ref 0.0–0.2)

## 2019-11-02 LAB — WET PREP, GENITAL
Sperm: NONE SEEN
Yeast Wet Prep HPF POC: NONE SEEN

## 2019-11-02 MED ORDER — METRONIDAZOLE 500 MG PO TABS
2000.0000 mg | ORAL_TABLET | Freq: Once | ORAL | Status: AC
  Administered 2019-11-02: 19:00:00 2000 mg via ORAL
  Filled 2019-11-02: qty 4

## 2019-11-02 NOTE — MAU Provider Note (Signed)
Patient Candace Rivera is a 34 y.o. G1P0  at [redacted]w[redacted]d here with complaints of SOB and pelvic pressure. She denies vaginal bleeding, LOF, decreased fetal movements, nausea, vomiting, fever, chest pain, chills, muscle aches.   She is well-appearing, no apparent distress or exertion with respiration.  History     CSN: [id number]  Arrival date and time: 11/02/19 1637   First Provider Initiated Contact with Patient 11/02/19 1724      Chief Complaint  Patient presents with  . Shortness of Breath   Shortness of Breath This is a new problem. The current episode started in the past 7 days. The problem has been unchanged. Pertinent negatives include no chest pain, fever or wheezing. She has tried nothing for the symptoms.  She reports some ongoing pelvic pain as well; it started last night. It comes and goes. It is not contractions but it feels like the baby is pushing downwards.   OB History    Gravida  1   Para      Term      Preterm      AB      Living        SAB      TAB      Ectopic      Multiple      Live Births              Past Medical History:  Diagnosis Date  . Appendicitis   . HSIL on Pap smear of cervix     Past Surgical History:  Procedure Laterality Date  . ABSCESS DRAINAGE    . APPENDECTOMY    . CYST REMOVAL NECK  2018    Family History  Problem Relation Age of Onset  . Heart disease Father   . Hypertension Father   . Diabetes Father     Social History   Tobacco Use  . Smoking status: Never Smoker  . Smokeless tobacco: Never Used  Substance Use Topics  . Alcohol use: Not Currently    Comment: occ  . Drug use: No    Allergies:  Allergies  Allergen Reactions  . Codeine Nausea Only  . Oxycodone Nausea Only    Medications Prior to Admission  Medication Sig Dispense Refill Last Dose  . Prenatal Vit-Fe Fumarate-FA (MULTIVITAMIN-PRENATAL) 27-0.8 MG TABS tablet Take 1 tablet by mouth daily at 12 noon.   11/02/2019 at Unknown time  .  Elastic Bandages & Supports (COMFORT FIT MATERNITY SUPP MED) MISC 1 Units by Does not apply route daily. (Patient not taking: Reported on 10/11/2019) 1 each 0   . Prenatal MV-Min-FA-Omega-3 (PRENATAL GUMMIES/DHA & FA PO) Take 2 tablets by mouth daily.       Review of Systems  Constitutional: Negative for fever.  Respiratory: Positive for shortness of breath. Negative for wheezing.   Cardiovascular: Negative for chest pain.  Genitourinary: Negative for dysuria, vaginal bleeding, vaginal discharge and vaginal pain.  Musculoskeletal: Negative.   Neurological: Negative.   Psychiatric/Behavioral: Negative.    Physical Exam   Blood pressure 124/74, pulse 88, temperature 98.3 F (36.8 C), temperature source Oral, resp. rate 14, height 5\' 4"  (1.626 m), weight 55.5 kg, last menstrual period 04/26/2019, SpO2 100 %.  Physical Exam  Constitutional: She appears well-developed.  HENT:  Head: Normocephalic.  Respiratory: Effort normal.  GI: Soft.  Genitourinary:    Genitourinary Comments: NEFG; cervix is long, closed, posterior. No suprapubic tenderness.    Musculoskeletal:        General:  Normal range of motion.     Cervical back: Normal range of motion.  Neurological: She is alert.  Skin: Skin is warm.    MAU Course  Procedures  MDM -NST: 140 bpm, mod var, present acel, neg decels, no contractions.  -EKG done; results reviewed with Dr. Nehemiah Settle who finds the results reassuring.  -Will draw CBC and CMP due to note in chart about elevated LFTs and possible anemia   Patient Vitals for the past 24 hrs:  BP Temp Temp src Pulse Resp SpO2 Height Weight  11/02/19 1711 124/74 98.3 F (36.8 C) Oral 88 14 100 % -- --  11/02/19 1702 -- -- -- -- -- -- 5\' 4"  (1.626 m) 55.5 kg   -wet prep positive for trich; counseling offered and patient will take 2 grams of Flagyl and partner treatment RX given.   2028: Patient's labs reviewed with Dr. Nehemiah Settle, who recommends discharge home with protein shake  recommendation and calcium pills.   Chest X ray not done as patient has only mild leukocytosis, no fever, no chest pain, no dyspnea, and 02 sat is 100.  Assessment and Plan   1. Hypoalbuminemia due to protein-calorie malnutrition ([place])   2. Encounter for supervision of low-risk pregnancy in second trimester    2. Reassured patient of normal changes in pregnancy which require diet modifications; hopefully with more protein she will feel stronger and less SOB.   3. Patient to be discharged home with recommendation to take PO calcium pills and recommendation to start drinking protein shakes.   4. Keep follow up ob appointment on 11-09-2019   Mervyn Skeeters [facility] 11/02/2019, 7:04 PM

## 2019-11-02 NOTE — Discharge Instructions (Signed)

## 2019-11-02 NOTE — MAU Note (Signed)
.   Candace Rivera is a 34 y.o. at [redacted]w[redacted]d here in MAU reporting: SOB that started x1 week ago. She reports its worse when she lays down and she cannot sleep at night. She states that there is no chest pain, she just feels that she cannot catch her breath when she is doing daily activities. Patient also reports pelvic pressure that began last night. She denies LOF or VB. Endorses + fetal movement. Patient reports no other COVID sx besides SOB.   Pain score: 7 Vitals:   11/02/19 1711  BP: 124/74  Pulse: 88  Resp: 14  Temp: 98.3 F (36.8 C)  SpO2: 100%     FHT:143 Lab orders placed from triage:

## 2019-11-03 LAB — GC/CHLAMYDIA PROBE AMP (~~LOC~~) NOT AT ARMC
Chlamydia: NEGATIVE
Comment: NEGATIVE
Comment: NORMAL
Neisseria Gonorrhea: NEGATIVE

## 2019-11-08 ENCOUNTER — Other Ambulatory Visit: Payer: Self-pay | Admitting: *Deleted

## 2019-11-08 DIAGNOSIS — E46 Unspecified protein-calorie malnutrition: Secondary | ICD-10-CM

## 2019-11-08 DIAGNOSIS — Z3490 Encounter for supervision of normal pregnancy, unspecified, unspecified trimester: Secondary | ICD-10-CM

## 2019-11-08 DIAGNOSIS — R748 Abnormal levels of other serum enzymes: Secondary | ICD-10-CM

## 2019-11-08 DIAGNOSIS — R112 Nausea with vomiting, unspecified: Secondary | ICD-10-CM

## 2019-11-08 NOTE — Addendum Note (Signed)
Addended by: Gerome Apley on: 11/08/2019 03:18 PM   Modules accepted: Orders

## 2019-11-09 ENCOUNTER — Encounter: Payer: Self-pay | Admitting: Medical

## 2019-11-09 ENCOUNTER — Other Ambulatory Visit: Payer: Medicaid Other

## 2019-11-09 ENCOUNTER — Other Ambulatory Visit: Payer: Self-pay

## 2019-11-09 ENCOUNTER — Ambulatory Visit (INDEPENDENT_AMBULATORY_CARE_PROVIDER_SITE_OTHER): Payer: Medicaid Other | Admitting: Medical

## 2019-11-09 VITALS — BP 109/72 | HR 83 | Wt 118.0 lb

## 2019-11-09 DIAGNOSIS — Z3492 Encounter for supervision of normal pregnancy, unspecified, second trimester: Secondary | ICD-10-CM

## 2019-11-09 DIAGNOSIS — O361930 Maternal care for other isoimmunization, third trimester, not applicable or unspecified: Secondary | ICD-10-CM | POA: Diagnosis not present

## 2019-11-09 DIAGNOSIS — Z3A28 28 weeks gestation of pregnancy: Secondary | ICD-10-CM

## 2019-11-09 DIAGNOSIS — R748 Abnormal levels of other serum enzymes: Secondary | ICD-10-CM

## 2019-11-09 DIAGNOSIS — E46 Unspecified protein-calorie malnutrition: Secondary | ICD-10-CM

## 2019-11-09 DIAGNOSIS — Z23 Encounter for immunization: Secondary | ICD-10-CM

## 2019-11-09 DIAGNOSIS — R112 Nausea with vomiting, unspecified: Secondary | ICD-10-CM

## 2019-11-09 DIAGNOSIS — R87613 High grade squamous intraepithelial lesion on cytologic smear of cervix (HGSIL): Secondary | ICD-10-CM

## 2019-11-09 DIAGNOSIS — Z3490 Encounter for supervision of normal pregnancy, unspecified, unspecified trimester: Secondary | ICD-10-CM

## 2019-11-09 NOTE — Progress Notes (Signed)
   PRENATAL VISIT NOTE  Subjective:  Candace Rivera is a 34 y.o. G1P0 at [redacted]w[redacted]d being seen today for ongoing prenatal care.  She is currently monitored for the following issues for this low-risk pregnancy and has Adjustment disorder with mixed disturbance of emotions and conduct; Self-inflicted laceration of wrist ([facility]); Supervision of low-risk pregnancy; Anti-Lewis a Ab; and HSIL (high grade squamous intraepithelial lesion) on Pap smear of cervix on their problem list.  Patient reports no complaints.  Contractions: Not present. Vag. Bleeding: None.  Movement: Present. Denies leaking of fluid.   The following portions of the patient's history were reviewed and updated as appropriate: allergies, current medications, past family history, past medical history, past social history, past surgical history and problem list.   Objective:   Vitals:   11/09/19 0855  BP: 109/72  Pulse: 83  Weight: 118 lb (53.5 kg)    Fetal Status: Fetal Heart Rate (bpm): 156 Fundal Height: 27 cm Movement: Present     General:  Alert, oriented and cooperative. Patient is in no acute distress.  Skin: Skin is warm and dry. No rash noted.   Cardiovascular: Normal heart rate noted  Respiratory: Normal respiratory effort, no problems with respiration noted  Abdomen: Soft, gravid, appropriate for gestational age.  Pain/Pressure: Absent     Pelvic: Cervical exam deferred        Extremities: Normal range of motion.  Edema: None  Mental Status: Normal mood and affect. Normal behavior. Normal judgment and thought content.   Assessment and Plan:  Pregnancy: G1P0 at [redacted]w[redacted]d 1. Encounter for supervision of low-risk pregnancy in second trimester - 2 hour GTT, CBC, HIV and RPR today  - Planning Nexplanon for [facility] - Desired outpatient circumcision, information given  - Peds list given and importance of choosing peds prior to 36 weeks discussed   2. HSIL (high grade squamous intraepithelial lesion) on Pap smear of cervix -  Needs PP LEEP   3. 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 Please refer to After Visit Summary for other counseling recommendations.   Return in about 2 weeks (around 11/23/2019) for LOB, Virtual.  Future Appointments  Date Time Provider Department Center  11/09/2019 10:00 AM [facility]    Vonzella Nipple, [place]

## 2019-11-09 NOTE — Progress Notes (Signed)
Tdap given today.

## 2019-11-09 NOTE — Patient Instructions (Addendum)
Fetal Movement Counts Patient Name: ________________________________________________ Patient Due Date: ____________________ What is a fetal movement count?  A fetal movement count is the number of times that you feel your baby move during a certain amount of time. This may also be called a fetal kick count. A fetal movement count is recommended for every pregnant woman. You may be asked to start counting fetal movements as early as week 28 of your pregnancy. Pay attention to when your baby is most active. You may notice your baby's sleep and wake cycles. You may also notice things that make your baby move more. You should do a fetal movement count:  When your baby is normally most active.  At the same time each day. A good time to count movements is while you are resting, after having something to eat and drink. How do I count fetal movements? 1. Find a quiet, comfortable area. Sit, or lie down on your side. 2. Write down the date, the start time and stop time, and the number of movements that you felt between those two times. Take this information with you to your health care visits. 3. Write down your start time when you feel the first movement. 4. Count kicks, flutters, swishes, rolls, and jabs. You should feel at least 10 movements. 5. You may stop counting after you have felt 10 movements, or if you have been counting for 2 hours. Write down the stop time. 6. If you do not feel 10 movements in 2 hours, contact your health care provider for further instructions. Your health care provider may want to do additional tests to assess your baby's well-being. Contact a health care provider if:  You feel fewer than 10 movements in 2 hours.  Your baby is not moving like he or she usually does. Date: ____________ Start time: ____________ Stop time: ____________ Movements: ____________ Date: ____________ Start time: ____________ Stop time: ____________ Movements: ____________ Date: ____________  Start time: ____________ Stop time: ____________ Movements: ____________ Date: ____________ Start time: ____________ Stop time: ____________ Movements: ____________ Date: ____________ Start time: ____________ Stop time: ____________ Movements: ____________ Date: ____________ Start time: ____________ Stop time: ____________ Movements: ____________ Date: ____________ Start time: ____________ Stop time: ____________ Movements: ____________ Date: ____________ Start time: ____________ Stop time: ____________ Movements: ____________ Date: ____________ Start time: ____________ Stop time: ____________ Movements: ____________ This information is not intended to replace advice given to you by your health care provider. Make sure you discuss any questions you have with your health care provider. Document Revised: 04/22/2019 Document Reviewed: 04/22/2019 Elsevier Patient Education  2020 Elsevier Inc. Braxton Hicks Contractions Contractions of the uterus can occur throughout pregnancy, but they are not always a sign that you are in labor. You may have practice contractions called Braxton Hicks contractions. These false labor contractions are sometimes confused with true labor. What are Braxton Hicks contractions? Braxton Hicks contractions are tightening movements that occur in the muscles of the uterus before labor. Unlike true labor contractions, these contractions do not result in opening (dilation) and thinning of the cervix. Toward the end of pregnancy (32-34 weeks), Braxton Hicks contractions can happen more often and may become stronger. These contractions are sometimes difficult to tell apart from true labor because they can be very uncomfortable. You should not feel embarrassed if you go to the hospital with false labor. Sometimes, the only way to tell if you are in true labor is for your health care provider to look for changes in the cervix. The health care provider   will do a physical exam and may  monitor your contractions. If you are not in true labor, the exam should show that your cervix is not dilating and your water has not broken. If there are no other health problems associated with your pregnancy, it is completely safe for you to be sent home with false labor. You may continue to have Braxton Hicks contractions until you go into true labor. How to tell the difference between true labor and false labor True labor  Contractions last 30-70 seconds.  Contractions become very regular.  Discomfort is usually felt in the top of the uterus, and it spreads to the lower abdomen and low back.  Contractions do not go away with walking.  Contractions usually become more intense and increase in frequency.  The cervix dilates and gets thinner. False labor  Contractions are usually shorter and not as strong as true labor contractions.  Contractions are usually irregular.  Contractions are often felt in the front of the lower abdomen and in the groin.  Contractions may go away when you walk around or change positions while lying down.  Contractions get weaker and are shorter-lasting as time goes on.  The cervix usually does not dilate or become thin. Follow these instructions at home:   Take over-the-counter and prescription medicines only as told by your health care provider.  Keep up with your usual exercises and follow other instructions from your health care provider.  Eat and drink lightly if you think you are going into labor.  If Braxton Hicks contractions are making you uncomfortable: ? Change your position from lying down or resting to walking, or change from walking to resting. ? Sit and rest in a tub of warm water. ? Drink enough fluid to keep your urine pale yellow. Dehydration may cause these contractions. ? Do slow and deep breathing several times an hour.  Keep all follow-up prenatal visits as told by your health care provider. This is important. Contact a  health care provider if:  You have a fever.  You have continuous pain in your abdomen. Get help right away if:  Your contractions become stronger, more regular, and closer together.  You have fluid leaking or gushing from your vagina.  You pass blood-tinged mucus (bloody show).  You have bleeding from your vagina.  You have low back pain that you never had before.  You feel your baby's head pushing down and causing pelvic pressure.  Your baby is not moving inside you as much as it used to. Summary  Contractions that occur before labor are called Braxton Hicks contractions, false labor, or practice contractions.  Braxton Hicks contractions are usually shorter, weaker, farther apart, and less regular than true labor contractions. True labor contractions usually become progressively stronger and regular, and they become more frequent.  Manage discomfort from Braxton Hicks contractions by changing position, resting in a warm bath, drinking plenty of water, or practicing deep breathing. This information is not intended to replace advice given to you by your health care provider. Make sure you discuss any questions you have with your health care provider. Document Revised: 08/15/2017 Document Reviewed: 01/16/2017 Elsevier Patient Education  2020 Elsevier Inc.  Places to have your son circumcised:                                                                        [facility]     [phone]   $480 while you are in hospital         [facility]              [phone]   $269 by 4 wks                      [facility]                     [phone]   $269 by 7 days [facility]                    [phone]   $269 by 4 wks [facility]             [phone]   $225 by 2 wks    These prices sometimes change but are roughly what you can expect to pay. Please call and confirm pricing.   Circumcision is considered an elective/non-medically necessary procedure. There are many reasons parents decide to have  their sons circumsized. During the first year of life circumcised males have a reduced risk of urinary tract infections but after this year the rates between circumcised males and uncircumcised males are the same.  It is safe to have your son circumcised outside of the hospital and the places above perform them regularly.   Deciding about Circumcision in Baby Boys  (Up-to-date The Basics)  What is circumcision?   Circumcision is a surgery that removes the skin that covers the tip of the penis, called the "foreskin" Circumcision is usually done when a boy is between 1 and 10 days old. In the [place], circumcision is common. In some other countries, fewer boys are circumcised. Circumcision is a common tradition in some religions.  Should I have my baby boy circumcised?   There is no easy answer. Circumcision has some benefits. But it also has risks. After talking with your doctor, you will have to decide for yourself what is right for your family.  What are the benefits of circumcision?   Circumcised boys seem to have slightly lower rates of: ?Urinary tract infections ?Swelling of the opening at the tip of the penis Circumcised men seem to have slightly lower rates of: ?Urinary tract infections ?Swelling of the opening at the tip of the penis ?Penis cancer ?HIV and other infections that you catch during sex ?Cervical cancer in the women they have sex with Even so, in the [place], the risks of these problems are small - even in boys and men who have not been circumcised. Plus, boys and men who are not circumcised can reduce these extra risks by: ?Cleaning their penis well ?Using condoms during sex  What are the risks of circumcision?  Risks include: ?Bleeding or infection from the surgery ?Damage to or amputation of the penis ?A chance that the doctor will cut off too much or not enough of the foreskin ?A chance that sex won't feel as good later in life Only about 1 out of  every 200 circumcisions leads to problems. There is also a chance that your health insurance won't pay for circumcision.  How is circumcision done in baby boys?  First, the baby gets medicine for pain relief. This might be a cream on the skin or a shot into the base of the penis. Next, the doctor cleans the baby's penis well. Then he or she uses special tools to cut off the foreskin. Finally, the doctor   wraps a bandage (called gauze) around the baby's penis. If you have your baby circumcised, his doctor or nurse will give you instructions on how to care for him after the surgery. It is important that you follow those instructions carefully.  AREA PEDIATRIC/FAMILY PRACTICE PHYSICIANS  [place] ([address]) . [facility] Melodie Bouillon, MD; Lum Babe, MD; Sheffield Slider, MD; Leveda Anna, MD; McDiarmid, MD; Jerene Bears, MD; Jennette Kettle, MD; Gwendolyn Grant, MD o [address] [place]., [place], [place] [address] o [phone] o Mon-Fri 8:30-12:30, 1:30-5:00 o Providers come to see babies at [facility] o Accepting Medicaid . [facility] o Limited providers who accept newborns: Docia Chuck, MD; Kateri Plummer, MD; Paulino Rily, MD o [address], [place], [place] [address] o [phone] o Mon-Fri 8:00-5:30 o Babies seen by providers at [facility] o Does NOT accept Medicaid o Please call early in hospitalization for appointment (limited availability)  . [facility] Fatima Sanger, MD o [address] [place], [place] [address] o [phone] o Mon, Tue, Thur, Fri 8:30-5:00, Wed 10:00-7:00 (closed 1-2pm) o Babies seen by [facility] providers o Accepting Medicaid . [facility] - Pediatrician Fae Pippin, MD o [address], [place], [place] [address] o [phone] o Mon-Fri 8:30-5:00, Sat 8:30-12:00 o Provider comes to see babies at [facility] o Accepting Medicaid o Must have been referred from current patients or contacted  office prior to delivery . [facility]) Leotis Pain, MD; Ave Filter, MD; Luna Fuse, MD; Kennedy Bucker, MD; Konrad Dolores, MD; Kathlene November, MD; Jenne Campus, MD; Lubertha South, MD; Wynetta Emery, MD; Duffy Rhody, MD; Gerre Couch, NP; Shirl Harris, NP o [address] [place], [place], [place] [address] o [phone] o Mon, Tue, Thur, Fri 8:30-5:30, Wed 9:30-5:30, Sat 8:30-12:30 o Babies seen by [facility] providers o Accepting Medicaid o Only accepting infants of first-time parents or siblings of current patients [facility] discharge coordinator will make follow-up appointment . Cyril Mourning o [address] [place], [place]  [address] o [phone]   Fax - [phone] . [facility] o [address], [place], [place]  [address] o Phone - [phone]   Fax - [phone] . Lucio Edward o [address], [place], [place]  [address] o [phone]  [place] [phone]) . [facility] Jorge Mandril, MD; Alita Chyle, MD; Princella Ion, MD; MD; Earlene Plater, MD; Jamesetta Orleans, MD; Alvera Novel, MD; Clarene Duke, MD; Rana Snare, MD; Carmon Ginsberg, MD; Alinda Money, MD; Hosie Poisson, MD; Mayford Knife, MD o [address] [place], [place] [address] o [phone] o Mon-Fri 8:30-5:00 (extended evenings Mon-Thur as needed), Sat-Sun 10:00-1:00 o Providers come to see babies at [facility] o Accepting Medicaid for families of first-time babies and families with all children in the household age 76 and under. Must register with office prior to making appointment (M-F only). [facility] Odella Aquas, NP; Lynelle Doctor, MD; Susann Givens, MD; [place], [place] o [address] [place], [place] [address] o [phone] o Mon-Fri 8:00-5:00 o Babies seen by providers at [facility] o Does NOT accept Medicaid/Commercial Insurance Only . [facility]  Suzette Battiest, MD; Zachery Dauer, MD; Stefan Church, MD; Sabino Dick, MD; Quitman Livings,  MD; Farris Has, MD; Gaynell Face, MD; Betha Loa, MD; Colon Flattery, MD; Clifton James, MD o [address] [place] [place], [place] [address] o [phone] o Mon-Fri 8:30-5:30, Sat (Oct.-Mar.) 9:00-1:00 o Babies seen by providers at [facility] o Accepting [facility] [phone]) . [facility] Dimitri, MD; Sheliah Hatch, MD o [address], [place], [place] [address] o [phone] o Mon-Fri 8:30-5:00, Sat 8:30-12:00 o Providers come to see babies at [facility] o Does NOT accept Medicaid . [facility] Cindy Hazy, [place]; Tea, MD; [place], [place]; Wynelle Link, MD; Azucena Cecil, MD o [address] [place], [place] [address] o [phone] o Mon-Fri 8:00-5:00 o Babies seen by providers at [facility] o Does NOT accept Medicaid o Only accepting babies of parents who are patients o Please call early in hospitalization for appointment (limited availability) . [facility] Lamar Benes, MD; Abran Cantor, MD; Early Osmond, MD; Cherre Huger, NP; Hyacinth Meeker, MD; Dwan Bolt, MD; Jarold Motto, NP; Dario Guardian, MD; Talmage Nap, MD; Maisie Fus, MD; Pricilla Holm, MD; Tama High, MD o [address] [place], [place], [place] [address] o [phone] o Mon-Fri 8:00-5:00, Sat 9:00-12:00 o Providers come to see babies at [facility] o Does NOT accept [facility] [phone]) . [facility] at [facility] o Limited providers accepting new patients: Drema Pry, NP; [place], [place] o [address], [place], [place] [address] o [phone] o Mon-Fri 8:00-5:00 o Babies seen by providers at [facility] o Does NOT accept Medicaid o Only accepting babies of parents who are patients o Please call early in hospitalization for appointment (limited availability) . [facility] Luan Pulling, MD; Nash Dimmer, MD o [address] [place] [place], [place] [address] o [phone] (press 1 to schedule appointment) o Mon-Fri 8:00-5:00 o Providers come to see babies at [facility] o Does NOT  accept Medicaid . [facility] Cristino Martes, MD o [address], [place], [place] [address] o [phone] o Mon-Fri 8:30-5:00 (lunch 12:30-1:00), extended hours by appointment only Wed 5:00-6:30 o Babies seen by [facility] providers o Accepting Medicaid . [facility], MD; [place], MD; Hassan Rowan, MD o [address] [place], [place], [place] [address] o [phone] o Mon-Fri 8:00-5:00 o Babies seen by [facility] providers o Does NOT accept Medicaid . Nature conservation officer at [facility] [address] Elsworth Soho, MD; Durene Cal, MD; [place], DO o [address] [place], [place] [address] o [phone] o Mon-Fri 8:00-5:00 o Babies seen by [facility] providers o Does NOT accept Medicaid . [facility] o [place], [place]; [place], [place]; [place], NP; Avis Epley, MD; Vonna Kotyk, MD; Clance Boll, MD; Stevphen Rochester, NP; Arvilla Market, NP; Ann Maki, NP; Otis Dials, NP; Vaughan Basta, MD; [place], MD o [address] [place], [place] [address] o [phone] o Mon-Fri 8:30-5:00, Sat 10:00-1:00 o Providers come to see babies at [facility] o Does NOT accept Medicaid o Free prenatal information session Tuesdays at 4:45pm . [facility] Luna Kitchens, MD; [place], [place]; [place], [place]; [place], [place] o [address] [place] [address] o [phone] o Mon-Fri 7:30-5:30 o Babies seen by [facility] providers . [facility] Doctor o [address], [place], [place]  [address] o [phone]   Fax - [phone]  [place] [phone] & [phone]) . [facility] Alphonsa Overall, MD o [address] [place], [place] [address] o [phone] o Mon-Thur 8:00-6:00 o Providers come to see babies at [facility] o Accepting Medicaid . [facility] Zenon Mayo, NP; Cyndia Bent, MD; [place], [place]; [place], [place] o [address] [place], [place] [address] o [phone] o Mon-Thur 7:30-7:30, Fri 7:30-4:30 o Babies seen by  [facility] providers o Accepting Medicaid . [facility] Cheryle Horsfall, MD; Janene Harvey, NP; Vonita Moss, MD o [address], [place], [place] [address]  o [phone] o Mon-Fri 8:30-5:00, Sat 8:30-12:00 o Providers come to see babies at [facility] o Accepting Medicaid o Must have "Meet & Greet" appointment at office prior to delivery . [facility] [place] [facility]) Llana Aliment, MD; Earlene Plater, MD; Lucretia Roers, MD o [address], [place], [place] [address] o [phone] o Mon-Wed 8:00-6:00, Thur-Fri 8:00-5:00, Sat 9:00-12:00 o Providers come to see babies at [facility] o Does NOT accept Medicaid o Only accepting siblings of current patients . [facility]  o [address], [place], [place]  [address] o [phone]   Fax - [phone] . [facility] o [phone] [address] [place], [place]  [address] o [phone]   Fax - [phone]  [place] [phone] & [phone]) . Nature conservation officer at [facility] o [place], DO; [place], DO o [address] [place], [place] [address] o [phone] o Mon-Fri 7:00-5:00 o Babies seen by [facility] providers o Does NOT accept Medicaid . [facility] Ellis Savage, MD; [place], [place]; [place], [place] o [address], [place], [place] [address] o [phone] o Mon-Fri 8:00-5:00 o Babies seen by [facility] providers o Accepting Medicaid . [facility] - [address] Franne Forts, MD; [place], [place]; [place], NP; [place], [place] o [address] [place], [place], [place] [address] o [phone] o Mon-Fri 8:00-5:00 o Babies seen by providers at [facility] o Accepting [facility] [place] [phone]) . [facility] [place], [place] o [address] [place], [place]  [address] o [phone] o Mon-Fri 8:00-5:00 o Babies seen by [facility] providers o Does NOT accept Medicaid o Limited availability, please call early in hospitalization to schedule follow-up . [facility] Jolee Ewing, PA; Eddie Candle, MD; [place], MD; [place], [place]; Constance Goltz, MD; [place], [place] o [facility] [address], [place], [place] [address] o [phone] o Mon-Fri 8:30-5:00, Sat 9:00-12:00 o Babies seen by providers at [facility] o Accepting Medicaid o Please register online then schedule online or call office o [email or web address] . [facility]) Samuella Bruin, NP; Lucianne Muss, MD; Lanier Clam, PA o [address], [place], [place] [address] o [phone] o Mon-Fri 8:00-5:00 o Babies seen by providers at [facility] o Accepting Medicaid . [facility]) Sharin Mons, MD; Reed Breech, NP; Shelva Majestic, MD o [address], [place], [place] [address] o [phone] o Mon-Fri 8:00-5:30, Sat&Sun by appointment (phones open at 8:30) o Babies seen by [facility] providers o Accepting Medicaid o Must be a first-time baby or sibling of current patient . [facility] [address], [place], [place]  [address] o [phone]   Fax - [phone]  [place] [phone] & [phone]) . [facility] o [place], [place]; [place], [place]; Dimple Casey, MD; [place], [place]; Carolyne Fiscal, MD o [address], [place], [place] [address] o [phone] o Mon-Thur 8:00-7:00, Fri 8:00-5:00, Sat 8:00-12:00, Sun 9:00-12:00 o Babies seen by [facility] providers o Accepting Medicaid . [facility] at [facility], MD; Gaynell Face, MD; [facility], MD o [address], [place], [place] [address] o [phone] o Mon-Thur 8:00-5:00 o Babies seen by providers at [facility] o Accepting  Medicaid . [facility] at [facility], MD; Coe[place], MD; Amedeo Plenty, MD; Bobby Rumpf, MD; List, MD; Lavonia Drafts, MD; Ruthann Cancer, MD; Selinda Eon, MD; Audie Box, MD; Jim Like, MD; Christie [place], MD; Hubbard Hartshorn, MD; Modena Nunnery, MD o [address] [place], [place] [address] o [phone] o Mon-Fri 8:00-5:30, Sat (Oct.-Mar.) 9:00-1:00 o Babies seen by providers at [facility] o Accepting Medicaid o Must fill out new patient packet, available online at [email or web address] . [place] ([facility]) Barnabas Lister, NP; Kenton Kingfisher, NP; Claiborne Billings, NP; Rolla Plate, MD; [place], [place]; Carola Rhine, MD; Tyron Russell, MD; Delia Chimes, NP o [address], [place], [place] [address] o [phone] o Mon-Thur 8:00-5:30, Fri 8:00-5:00 o Babies seen by providers at [place] [phone]) . [place], [place]; [place], [place]; Dennard Schaumann, MD; [place], [place] o [address] [place], [place] [address] o [phone] o Mon-Fri 8:00-5:00 o Babies seen by providers at [place] [phone]) . [place] at [place], [place]; Olen Pel, MD; [place], [place], [place], [place] [address] o [phone] o Mon-Fri 8:00-5:00 o Babies seen by providers at [facility] o Does NOT accept Medicaid o Limited appointment availability, please call early in hospitalization  . Therapist, music at [address], [place]; [place], [address] [place], [place] [address] o [phone] o Mon-Fri 8:00-5:00 o Babies seen by [facility] providers o Does NOT accept Medicaid . [facility] Su Grand, MD; Guy Sandifer, MD; [place], [place]; [place], [address], [place], [place] [address] o [phone] o Mon-Fri 8:00-5:00 o After hours clinic [facility] [place], [place] [address])  [phone] Mon-Fri 5:00-8:00, Sat 12:00-6:00, Sun 10:00-4:00 o Babies seen by [facility] providers o Accepting Medicaid . [place] at [facility] o [address], [place], [place]  [address] o [phone]   Fax - [phone]  [place] [phone]) . Therapist, music at [facility], MD o [address] [place], [place], [place] [address] o [phone] o Mon-Fri 8:00-5:00 o Babies seen by [facility] providers o Does NOT accept Medicaid . [place] ([place] [facility] [place]) Bing Neighbors, MD o [address] [place], [place], [place] [address] o [phone] o Mon-Thur 8:00-7:00, Fri 8:00-5:00, Sat 8:00-12:00 o Babies seen by providers at [facility] o Accepting Medicaid - but does not have vaccinations in office (must be received elsewhere) o Limited availability, please call early in hospitalization  [place] ([id number]) . [place], [place] [address] [place] [address] o [phone]  Fax [phone]

## 2019-11-10 LAB — COMPREHENSIVE METABOLIC PANEL
ALT: 17 IU/L (ref 0–32)
AST: 21 IU/L (ref 0–40)
Albumin/Globulin Ratio: 1 — ABNORMAL LOW (ref 1.2–2.2)
Albumin: 3.1 g/dL — ABNORMAL LOW (ref 3.8–4.8)
Alkaline Phosphatase: 86 IU/L (ref 39–117)
BUN/Creatinine Ratio: 12 (ref 9–23)
BUN: 6 mg/dL (ref 6–20)
Bilirubin Total: <0.2 mg/dL (ref 0.0–1.2)
CO2: 20 mmol/L (ref 20–29)
Calcium: 8.4 mg/dL — ABNORMAL LOW (ref 8.7–10.2)
Chloride: 104 mmol/L (ref 96–106)
Creatinine, Ser: 0.5 mg/dL — ABNORMAL LOW (ref 0.57–1.00)
GFR calc Af Amer: 149 mL/min/{1.73_m2} (ref >59)
GFR calc non Af Amer: 129 mL/min/{1.73_m2} (ref >59)
Globulin, Total: 3 g/dL (ref 1.5–4.5)
Glucose: 64 mg/dL — ABNORMAL LOW (ref 65–99)
Potassium: 3.4 mmol/L — ABNORMAL LOW (ref 3.5–5.2)
Sodium: 138 mmol/L (ref 134–144)
Total Protein: 6.1 g/dL (ref 6.0–8.5)

## 2019-11-10 LAB — HEPATITIS PANEL, ACUTE
Hep A IgM: NEGATIVE
Hep B C IgM: NEGATIVE
Hep C Virus Ab: <0.1 s/co ratio (ref 0.0–0.9)
Hepatitis B Surface Ag: NEGATIVE

## 2019-11-10 LAB — CBC
Hematocrit: 31.9 % — ABNORMAL LOW (ref 34.0–46.6)
Hemoglobin: 10.7 g/dL — ABNORMAL LOW (ref 11.1–15.9)
MCH: 30.7 pg (ref 26.6–33.0)
MCHC: 33.5 g/dL (ref 31.5–35.7)
MCV: 91 fL (ref 79–97)
Platelets: 292 10*3/uL (ref 150–450)
RBC: 3.49 x10E6/uL — ABNORMAL LOW (ref 3.77–5.28)
RDW: 12.2 % (ref 11.7–15.4)
WBC: 8.4 10*3/uL (ref 3.4–10.8)

## 2019-11-10 LAB — GLUCOSE TOLERANCE, 2 HOURS W/ 1HR
Glucose, 1 hour: 151 mg/dL (ref 65–179)
Glucose, 2 hour: 112 mg/dL (ref 65–152)
Glucose, Fasting: 68 mg/dL (ref 65–91)

## 2019-11-10 LAB — RPR: RPR Ser Ql: NONREACTIVE

## 2019-11-10 LAB — HIV ANTIBODY (ROUTINE TESTING W REFLEX): HIV Screen 4th Generation wRfx: NONREACTIVE

## 2019-11-15 ENCOUNTER — Inpatient Hospital Stay (HOSPITAL_COMMUNITY)
Admission: AD | Admit: 2019-11-15 | Discharge: 2019-11-15 | Disposition: A | Discharge status: Home / Self Care | Payer: Medicaid Other | Attending: Obstetrics & Gynecology | Admitting: Obstetrics & Gynecology

## 2019-11-15 ENCOUNTER — Encounter: Payer: Self-pay | Admitting: Medical

## 2019-11-15 ENCOUNTER — Other Ambulatory Visit: Payer: Self-pay

## 2019-11-15 ENCOUNTER — Encounter (HOSPITAL_COMMUNITY): Payer: Self-pay | Admitting: Obstetrics & Gynecology

## 2019-11-15 DIAGNOSIS — Z3689 Encounter for other specified antenatal screening: Secondary | ICD-10-CM | POA: Diagnosis not present

## 2019-11-15 DIAGNOSIS — Z3A29 29 weeks gestation of pregnancy: Secondary | ICD-10-CM

## 2019-11-15 DIAGNOSIS — O368130 Decreased fetal movements, third trimester, not applicable or unspecified: Secondary | ICD-10-CM | POA: Diagnosis present

## 2019-11-15 NOTE — MAU Provider Note (Signed)
History     CSN: [id number]  Arrival date and time: 11/15/19 1304   First Provider Initiated Contact with Patient 11/15/19 1351      Chief Complaint  Patient presents with  . Decreased Fetal Movement   HPI  Ms.  Candace Rivera is a 34 y.o. year old G1P0 female at 102w0d weeks gestation who presents to MAU reporting DFM since Saturday 11/13/2019. 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 So may not be as aware of his movements." She has tried eating something and drinking something, but still thinks his  movements are decreased. She reports she at 1/2 sub today. She denies any pain or VB. She receives [facility]; next appt is 11/24/2019.   Past Medical History:  Diagnosis Date  . Appendicitis   . HSIL on Pap smear of cervix     Past Surgical History:  Procedure Laterality Date  . ABSCESS DRAINAGE    . APPENDECTOMY    . CYST REMOVAL NECK  2018    Family History  Problem Relation Age of Onset  . Heart disease Father   . Hypertension Father   . Diabetes Father     Social History   Tobacco Use  . Smoking status: Never Smoker  . Smokeless tobacco: Never Used  Substance Use Topics  . Alcohol use: Not Currently    Comment: occ  . Drug use: No    Allergies:  Allergies  Allergen Reactions  . Codeine Nausea Only  . Oxycodone Nausea Only    Medications Prior to Admission  Medication Sig Dispense Refill Last Dose  . Elastic Bandages & Supports (COMFORT FIT MATERNITY SUPP MED) MISC 1 Units by Does not apply route daily. 1 each 0 Past Month at Unknown time  . Prenatal MV-Min-FA-Omega-3 (PRENATAL GUMMIES/DHA & FA PO) Take 2 tablets by mouth daily.   11/14/2019 at Unknown time  . Prenatal Vit-Fe Fumarate-FA (MULTIVITAMIN-PRENATAL) 27-0.8 MG TABS tablet Take 1 tablet by mouth daily at 12 noon.       Review of Systems  Constitutional: Negative.   HENT:  Negative.   Eyes: Negative.   Respiratory: Negative.   Cardiovascular: Negative.   Gastrointestinal: Negative.   Endocrine: Negative.   Genitourinary:       DFM since Saturday 11/13/2019  Musculoskeletal: Negative.   Skin: Negative.   Allergic/Immunologic: Negative.   Neurological: Negative.   Hematological: Negative.   Psychiatric/Behavioral: Negative.    Physical Exam   Blood pressure 118/79, pulse (!) 101, temperature 98.1 F (36.7 C), resp. rate 16, last menstrual period 04/26/2019, SpO2 99 %.  Physical Exam  Nursing note and vitals reviewed. Constitutional: She is oriented to person, place, and time. She appears well-developed and well-nourished.  HENT:  Head: Normocephalic and atraumatic.  Eyes: Pupils are equal, round, and reactive to light.  Cardiovascular: Normal rate.  Respiratory: Effort normal.  GI: Soft.  Genitourinary:    Genitourinary Comments: pelvic deferred   Musculoskeletal:        General: Normal range of motion.     Cervical back: Normal range of motion.  Neurological: She is alert and oriented to person, place, and time.  Skin: Skin is warm and dry.  Psychiatric: She has a normal mood and affect. Her behavior is normal. Judgment and thought content normal.   NST - FHR: 150 bpm / moderate variability / accels present / decels  absent / TOCO: UI noted  MAU Course  Procedures  MDM CEFM  Assessment and Plan  NST (non-stress test) reactive - Plan: Discharge patient - Information provided on FKC - Reassurance given that fetal well-being is good today - Advised to perform FKC when decreased or no FM is suspected, if remains decreased or no movements, come to MAU immediately - Discharge home - Keep scheduled appt on 11/24/2019 - Patient verbalized an understanding of the plan of care and agrees.    Raelyn Mora, MSN, CNM 11/15/2019, 1:51 PM

## 2019-11-15 NOTE — MAU Note (Signed)
Pt presents to MAU with c/o decreased fetal movement since Saturday, she has felt some movements but baby has not been moving as normal for him. She did try eating/drinking and still thinks movements are decreased. She denies pain,  VB and LOF.

## 2019-11-24 ENCOUNTER — Telehealth (INDEPENDENT_AMBULATORY_CARE_PROVIDER_SITE_OTHER): Payer: Medicaid Other | Admitting: Student

## 2019-11-24 DIAGNOSIS — Z3493 Encounter for supervision of normal pregnancy, unspecified, third trimester: Secondary | ICD-10-CM | POA: Diagnosis not present

## 2019-11-24 DIAGNOSIS — Z3A30 30 weeks gestation of pregnancy: Secondary | ICD-10-CM

## 2019-11-24 DIAGNOSIS — Z3492 Encounter for supervision of normal pregnancy, unspecified, second trimester: Secondary | ICD-10-CM

## 2019-11-24 NOTE — Progress Notes (Signed)
   TELEHEALTH OBSTETRICS PRENATAL VIRTUAL VIDEO VISIT ENCOUNTER NOTE  Provider location: [facility] [employer] at [place]   I connected with [facility] on 11/24/19 at 10:35 AM EST by MyChart Video Encounter at home and verified that I am speaking with the correct person using two identifiers.   I discussed the limitations, risks, security and privacy concerns of performing an evaluation and management service virtually and the availability of in person appointments. I also discussed with the patient that there may be a patient responsible charge related to this service. The patient expressed understanding and agreed to proceed. Subjective:  Candace Rivera is a 34 y.o. G1P0 at [redacted]w[redacted]d being seen today for ongoing prenatal care.  She is currently monitored for the following issues for this low-risk pregnancy and has Adjustment disorder with mixed disturbance of emotions and conduct; Self-inflicted laceration of wrist (HCC); Supervision of low-risk pregnancy; Anti-Lewis a Ab; and HSIL (high grade squamous intraepithelial lesion) on Pap smear of cervix on their problem list.  Patient reports decreased fetal movement. Has felt movement this morning but not as strong or as much as normal. States baby is normally "all over the place".  Contractions: Not present. Vag. Bleeding: None.  Movement: Present. Denies any leaking of fluid.   The following portions of the patient's history were reviewed and updated as appropriate: allergies, current medications, past family history, past medical history, past social history, past surgical history and problem list.   Objective:   Vitals:   11/24/19 1037  BP: 123/75  Pulse: 95    Fetal Status:     Movement: Present     General:  Alert, oriented and cooperative. Patient is in no acute distress.  Respiratory: Normal respiratory effort, no problems with respiration noted  Mental Status: Normal mood and affect. Normal behavior. Normal judgment and  thought content.  Rest of physical exam deferred due to type of encounter  Imaging: No results found.  Assessment and Plan:  Pregnancy: G1P0 at [redacted]w[redacted]d 1. Encounter for supervision of low-risk pregnancy in second trimester -reviewed fetal kick counts. Discussed that she should f/u with MAU for any concerns so NST can be performed.  -doing well overall. Reviewed previous labs, passed diabetes screening! -discussed upcoming visits  Preterm labor symptoms and general obstetric precautions including but not limited to vaginal bleeding, contractions, leaking of fluid and fetal movement were reviewed in detail with the patient. I discussed the assessment and treatment plan with the patient. The patient was provided an opportunity to ask questions and all were answered. The patient agreed with the plan and demonstrated an understanding of the instructions. The patient was advised to call back or seek an in-person office evaluation/go to MAU at [facility] for any urgent or concerning symptoms. Please refer to After Visit Summary for other counseling recommendations.   I provided 12 minutes of face-to-face time during this encounter.  No follow-ups on file.  Future Appointments  Date Time Provider Department Center  12/08/2019  9:35 AM Kathlene Cote [facility]  12/21/2019  9:35 AM Armando Reichert, CNM [facility]  01/04/2020  9:35 AM Armando Reichert, CNM [facility]    Judeth Horn, NP [facility] [employer], [facility]

## 2019-11-24 NOTE — Progress Notes (Signed)
I connected with  [facility] on 11/24/19 at 10:35 AM EST by telephone and verified that I am speaking with the correct person using two identifiers.   I discussed the limitations, risks, security and privacy concerns of performing an evaluation and management service by telephone and the availability of in person appointments. I also discussed with the patient that there may be a patient responsible charge related to this service. The patient expressed understanding and agreed to proceed.  Henrietta Dine, CMA 11/24/2019  10:35 AM

## 2019-11-24 NOTE — Patient Instructions (Signed)
Fetal Movement Counts Patient Name: ________________________________________________ Patient Due Date: ____________________ What is a fetal movement count?  A fetal movement count is the number of times that you feel your baby move during a certain amount of time. This may also be called a fetal kick count. A fetal movement count is recommended for every pregnant woman. You may be asked to start counting fetal movements as early as week 28 of your pregnancy. Pay attention to when your baby is most active. You may notice your baby's sleep and wake cycles. You may also notice things that make your baby move more. You should do a fetal movement count:  When your baby is normally most active.  At the same time each day. A good time to count movements is while you are resting, after having something to eat and drink. How do I count fetal movements? 1. Find a quiet, comfortable area. Sit, or lie down on your side. 2. Write down the date, the start time and stop time, and the number of movements that you felt between those two times. Take this information with you to your health care visits. 3. Write down your start time when you feel the first movement. 4. Count kicks, flutters, swishes, rolls, and jabs. You should feel at least 10 movements. 5. You may stop counting after you have felt 10 movements, or if you have been counting for 2 hours. Write down the stop time. 6. If you do not feel 10 movements in 2 hours, contact your health care provider for further instructions. Your health care provider may want to do additional tests to assess your baby's well-being. Contact a health care provider if:  You feel fewer than 10 movements in 2 hours.  Your baby is not moving like he or she usually does. Date: ____________ Start time: ____________ Stop time: ____________ Movements: ____________ Date: ____________ Start time: ____________ Stop time: ____________ Movements: ____________ Date: ____________  Start time: ____________ Stop time: ____________ Movements: ____________ Date: ____________ Start time: ____________ Stop time: ____________ Movements: ____________ Date: ____________ Start time: ____________ Stop time: ____________ Movements: ____________ Date: ____________ Start time: ____________ Stop time: ____________ Movements: ____________ Date: ____________ Start time: ____________ Stop time: ____________ Movements: ____________ Date: ____________ Start time: ____________ Stop time: ____________ Movements: ____________ Date: ____________ Start time: ____________ Stop time: ____________ Movements: ____________ This information is not intended to replace advice given to you by your health care provider. Make sure you discuss any questions you have with your health care provider. Document Revised: 04/22/2019 Document Reviewed: 04/22/2019 Elsevier Patient Education  2020 Elsevier Inc.  

## 2019-12-08 ENCOUNTER — Telehealth (INDEPENDENT_AMBULATORY_CARE_PROVIDER_SITE_OTHER): Payer: Medicaid Other

## 2019-12-08 ENCOUNTER — Other Ambulatory Visit: Payer: Self-pay

## 2019-12-08 VITALS — BP 118/80 | HR 87

## 2019-12-08 DIAGNOSIS — F4325 Adjustment disorder with mixed disturbance of emotions and conduct: Secondary | ICD-10-CM

## 2019-12-08 DIAGNOSIS — O99343 Other mental disorders complicating pregnancy, third trimester: Secondary | ICD-10-CM | POA: Diagnosis not present

## 2019-12-08 DIAGNOSIS — Z3A32 32 weeks gestation of pregnancy: Secondary | ICD-10-CM

## 2019-12-08 DIAGNOSIS — Z3492 Encounter for supervision of normal pregnancy, unspecified, second trimester: Secondary | ICD-10-CM

## 2019-12-08 NOTE — Progress Notes (Signed)
I connected with  [facility] on 12/08/19 at  9:35 AM EDT by telephone and verified that I am speaking with the correct person using two identifiers.   I discussed the limitations, risks, security and privacy concerns of performing an evaluation and management service by telephone and the availability of in person appointments. I also discussed with the patient that there may be a patient responsible charge related to this service. The patient expressed understanding and agreed to proceed.  Ernestina Patches, CMA 12/08/2019  9:37 AM

## 2019-12-08 NOTE — Progress Notes (Signed)
I connected with@ on 12/08/19 at  9:35 AM EDT by: Mychart and verified that I am speaking with the correct person using two identifiers.  Patient is located at home and provider is located at [facility].     The purpose of this virtual visit is to provide medical care while limiting exposure to the novel coronavirus. I discussed the limitations, risks, security and privacy concerns of performing an evaluation and management service by [employer] virtual visit and the availability of in person appointments. I also discussed with the patient that there may be a patient responsible charge related to this service. By engaging in this virtual visit, you consent to the provision of healthcare.  Additionally, you authorize for your insurance to be billed for the services provided during this visit.  The patient expressed understanding and agreed to proceed.  The following staff members participated in the virtual visit:  Candace Rivera    PRENATAL VISIT NOTE  Subjective:  Candace Rivera is a 34 y.o. G1P0 at [redacted]w[redacted]d  for phone visit for ongoing prenatal care.  She is currently monitored for the following issues for this high-risk pregnancy and has Adjustment disorder with mixed disturbance of emotions and conduct; Self-inflicted laceration of wrist (HCC); Supervision of low-risk pregnancy; Anti-Lewis a Ab; and HSIL (high grade squamous intraepithelial lesion) on Pap smear of cervix on their problem list.  Patient reports no complaints.  Contractions: Not present. Vag. Bleeding: None.  Movement: Present. Denies leaking of fluid. Patient reports some infrequent abdominal tightness and vaginal pain, but is otherwise doing well.   The following portions of the patient's history were reviewed and updated as appropriate: allergies, current medications, past family history, past medical history, past social history, past surgical history and problem list.   Objective:   Vitals:   12/08/19 0937  BP: 118/80  Pulse: 87   Self-Obtained  Fetal Status:     Movement: Present     Assessment and Plan:  Pregnancy: G1P0 at [redacted]w[redacted]d 1. Encounter for supervision of low-risk pregnancy in second trimester -Anticipatory guidance for upcoming appts. -Discussed normalcy of BH contractions.  Reviewed when and where to go for PTL. -Encouraged to find pediatrician. -Informed that list can be provided at next visit.  2. Adjustment disorder with mixed disturbance of emotions and conduct -Patient denies SI/HI behaviors. -Endorses safety at home   Preterm labor symptoms and general obstetric precautions including but not limited to vaginal bleeding, contractions, leaking of fluid and fetal movement were reviewed in detail with the patient.  No follow-ups on file.  Future Appointments  Date Time Provider Department Center  12/21/2019  9:35 AM Armando Reichert, CNM [facility]  01/04/2020  9:35 AM Armando Reichert, CNM [facility]     Time spent on virtual visit: 6 minutes  Cherre Robins, CNM

## 2019-12-21 ENCOUNTER — Encounter: Payer: Self-pay | Admitting: Advanced Practice Midwife

## 2019-12-21 ENCOUNTER — Telehealth (INDEPENDENT_AMBULATORY_CARE_PROVIDER_SITE_OTHER): Payer: Medicaid Other | Admitting: Advanced Practice Midwife

## 2019-12-21 DIAGNOSIS — Z3493 Encounter for supervision of normal pregnancy, unspecified, third trimester: Secondary | ICD-10-CM

## 2019-12-21 NOTE — Progress Notes (Signed)
   TELEHEALTH VIRTUAL OBSTETRICS VISIT ENCOUNTER NOTE  I connected with [facility] on 12/21/19 at  9:35 AM EDT by telephone at home and verified that I am speaking with the correct person using two identifiers.   I discussed the limitations, risks, security and privacy concerns of performing an evaluation and management service by telephone and the availability of in person appointments. I also discussed with the patient that there may be a patient responsible charge related to this service. The patient expressed understanding and agreed to proceed.  Subjective:  Candace Rivera is a 34 y.o. G1P0 at [redacted]w[redacted]d being followed for ongoing prenatal care.  She is currently monitored for the following issues for this low-risk pregnancy and has Adjustment disorder with mixed disturbance of emotions and conduct; Self-inflicted laceration of wrist (HCC); Supervision of low-risk pregnancy; Anti-Lewis a Ab; and HSIL (high grade squamous intraepithelial lesion) on Pap smear of cervix on their problem list.  Patient reports hip pain from baby settling into pelvis . Reports fetal movement. Denies any contractions, bleeding or leaking of fluid.   The following portions of the patient's history were reviewed and updated as appropriate: allergies, current medications, past family history, past medical history, past social history, past surgical history and problem list.   Objective:   General:  Alert, oriented and cooperative.   Mental Status: Normal mood and affect perceived. Normal judgment and thought content.  Rest of physical exam deferred due to type of encounter  Assessment and Plan:  Pregnancy: G1P0 at [redacted]w[redacted]d 1. Encounter for supervision of low-risk pregnancy in third trimester - routine care  Preterm labor symptoms and general obstetric precautions including but not limited to vaginal bleeding, contractions, leaking of fluid and fetal movement were reviewed in detail with the patient.  I  discussed the assessment and treatment plan with the patient. The patient was provided an opportunity to ask questions and all were answered. The patient agreed with the plan and demonstrated an understanding of the instructions. The patient was advised to call back or seek an in-person office evaluation/go to MAU at [facility] for any urgent or concerning symptoms. Please refer to After Visit Summary for other counseling recommendations.   I provided 12 minutes of non-face-to-face time during this encounter.  Return in about 2 weeks (around 01/04/2020) for In person visit for 36 week labs .  Future Appointments  Date Time Provider Department Center  01/04/2020  9:35 AM Armando Reichert, CNM [facility] W[facility]    Thressa Sheller DNP, CNM  12/21/19  9:54 AM

## 2019-12-21 NOTE — Progress Notes (Signed)
9:26a - Called pt for My Chart visit at both #'s on file, no answers, also was not able to leave VM. Will call pt back in 10 to 15 minutes.   I connected with  [facility] on 12/21/19 at  9:35 AM EDT by telephone and verified that I am speaking with the correct person using two identifiers.   I discussed the limitations, risks, security and privacy concerns of performing an evaluation and management service by telephone and the availability of in person appointments. I also discussed with the patient that there may be a patient responsible charge related to this service. The patient expressed understanding and agreed to proceed.  Henrietta Dine, CMA 12/21/2019  9:42 AM

## 2020-01-04 ENCOUNTER — Other Ambulatory Visit (HOSPITAL_COMMUNITY)
Admission: RE | Admit: 2020-01-04 | Discharge: 2020-01-04 | Disposition: A | Discharge status: Home / Self Care | Payer: Medicaid Other | Source: Ambulatory Visit | Attending: Advanced Practice Midwife | Admitting: Advanced Practice Midwife

## 2020-01-04 ENCOUNTER — Ambulatory Visit (INDEPENDENT_AMBULATORY_CARE_PROVIDER_SITE_OTHER): Payer: Medicaid Other | Admitting: Advanced Practice Midwife

## 2020-01-04 ENCOUNTER — Other Ambulatory Visit: Payer: Self-pay

## 2020-01-04 VITALS — BP 129/82 | HR 78 | Wt 136.8 lb

## 2020-01-04 DIAGNOSIS — Z3A36 36 weeks gestation of pregnancy: Secondary | ICD-10-CM

## 2020-01-04 DIAGNOSIS — Z3493 Encounter for supervision of normal pregnancy, unspecified, third trimester: Secondary | ICD-10-CM | POA: Insufficient documentation

## 2020-01-04 NOTE — Progress Notes (Signed)
   PRENATAL VISIT NOTE  Subjective:  Candace Rivera is a 34 y.o. G1P0 at 104w1d being seen today for ongoing prenatal care.  She is currently monitored for the following issues for this low-risk pregnancy and has Adjustment disorder with mixed disturbance of emotions and conduct; Self-inflicted laceration of wrist (HCC); Supervision of low-risk pregnancy; Anti-Lewis a Ab; and HSIL (high grade squamous intraepithelial lesion) on Pap smear of cervix on their problem list.  Patient reports no complaints.  Contractions: Irregular. Vag. Bleeding: None.  Movement: Present. Denies leaking of fluid.   The following portions of the patient's history were reviewed and updated as appropriate: allergies, current medications, past family history, past medical history, past social history, past surgical history and problem list.   Objective:   Vitals:   01/04/20 0934  BP: 129/82  Pulse: 78  Weight: 136 lb 12.8 oz (62.1 kg)    Fetal Status: Fetal Heart Rate (bpm): 142 Fundal Height: 36 cm Movement: Present     General:  Alert, oriented and cooperative. Patient is in no acute distress.  Skin: Skin is warm and dry. No rash noted.   Cardiovascular: Normal heart rate noted  Respiratory: Normal respiratory effort, no problems with respiration noted  Abdomen: Soft, gravid, appropriate for gestational age.  Pain/Pressure: Present     Pelvic: Cervical exam deferred        Extremities: Normal range of motion.  Edema: Trace  Mental Status: Normal mood and affect. Normal behavior. Normal judgment and thought content.   Assessment and Plan:  Pregnancy: G1P0 at [redacted]w[redacted]d 1. Encounter for supervision of low-risk pregnancy in third trimester - Culture, beta strep (group b only) - GC/Chlamydia probe amp ([place])not at [facility]  Term labor symptoms and general obstetric precautions including but not limited to vaginal bleeding, contractions, leaking of fluid and fetal movement were reviewed in detail with the  patient. Please refer to After Visit Summary for other counseling recommendations.   Return in about 1 week (around 01/11/2020).  Future Appointments  Date Time Provider Department Center  01/18/2020  1:35 PM Eugenio Hoes [facility]  01/25/2020  9:55 AM Marvetta Gibbons, Brand Males, NP [facility]    Thressa Sheller DNP, CNM  01/04/20  10:40 AM

## 2020-01-05 LAB — GC/CHLAMYDIA PROBE AMP (~~LOC~~) NOT AT ARMC
Chlamydia: NEGATIVE
Comment: NEGATIVE
Comment: NORMAL
Neisseria Gonorrhea: NEGATIVE

## 2020-01-07 LAB — CULTURE, BETA STREP (GROUP B ONLY): Strep Gp B Culture: NEGATIVE

## 2020-01-15 ENCOUNTER — Encounter (HOSPITAL_COMMUNITY): Payer: Self-pay | Admitting: Obstetrics and Gynecology

## 2020-01-15 ENCOUNTER — Other Ambulatory Visit: Payer: Self-pay

## 2020-01-15 ENCOUNTER — Inpatient Hospital Stay (HOSPITAL_COMMUNITY): Payer: Medicaid Other | Admitting: Anesthesiology

## 2020-01-15 ENCOUNTER — Inpatient Hospital Stay (HOSPITAL_COMMUNITY)
Admission: AD | Admit: 2020-01-15 | Discharge: 2020-01-18 | DRG: 768 | Disposition: A | Discharge status: Home / Self Care | Payer: Medicaid Other | Attending: Obstetrics & Gynecology | Admitting: Obstetrics & Gynecology

## 2020-01-15 DIAGNOSIS — Z3A37 37 weeks gestation of pregnancy: Secondary | ICD-10-CM

## 2020-01-15 DIAGNOSIS — O134 Gestational [pregnancy-induced] hypertension without significant proteinuria, complicating childbirth: Secondary | ICD-10-CM | POA: Diagnosis present

## 2020-01-15 DIAGNOSIS — D069 Carcinoma in situ of cervix, unspecified: Secondary | ICD-10-CM | POA: Diagnosis present

## 2020-01-15 DIAGNOSIS — R87613 High grade squamous intraepithelial lesion on cytologic smear of cervix (HGSIL): Secondary | ICD-10-CM | POA: Diagnosis present

## 2020-01-15 DIAGNOSIS — K5900 Constipation, unspecified: Secondary | ICD-10-CM | POA: Diagnosis present

## 2020-01-15 DIAGNOSIS — O361990 Maternal care for other isoimmunization, unspecified trimester, not applicable or unspecified: Secondary | ICD-10-CM | POA: Diagnosis present

## 2020-01-15 DIAGNOSIS — O411230 Chorioamnionitis, third trimester, not applicable or unspecified: Secondary | ICD-10-CM | POA: Diagnosis present

## 2020-01-15 DIAGNOSIS — O139 Gestational [pregnancy-induced] hypertension without significant proteinuria, unspecified trimester: Secondary | ICD-10-CM | POA: Diagnosis present

## 2020-01-15 DIAGNOSIS — Z3493 Encounter for supervision of normal pregnancy, unspecified, third trimester: Secondary | ICD-10-CM

## 2020-01-15 DIAGNOSIS — Z3490 Encounter for supervision of normal pregnancy, unspecified, unspecified trimester: Secondary | ICD-10-CM

## 2020-01-15 DIAGNOSIS — O99893 Other specified diseases and conditions complicating puerperium: Secondary | ICD-10-CM | POA: Diagnosis present

## 2020-01-15 DIAGNOSIS — Z20822 Contact with and (suspected) exposure to covid-19: Secondary | ICD-10-CM | POA: Diagnosis present

## 2020-01-15 DIAGNOSIS — O99892 Other specified diseases and conditions complicating childbirth: Secondary | ICD-10-CM | POA: Diagnosis present

## 2020-01-15 DIAGNOSIS — N879 Dysplasia of cervix uteri, unspecified: Secondary | ICD-10-CM | POA: Diagnosis present

## 2020-01-15 HISTORY — DX: Adjustment disorder with mixed disturbance of emotions and conduct: F43.25

## 2020-01-15 HISTORY — DX: Intentional self-harm by unspecified sharp object, initial encounter: X78.9XXA

## 2020-01-15 HISTORY — DX: Laceration without foreign body of unspecified wrist, initial encounter: S61.519A

## 2020-01-15 HISTORY — DX: Gestational (pregnancy-induced) hypertension without significant proteinuria, unspecified trimester: O13.9

## 2020-01-15 LAB — CBC
HCT: 37.3 % (ref 36.0–46.0)
HCT: 35.3 % — ABNORMAL LOW (ref 36.0–46.0)
Hemoglobin: 12.3 g/dL (ref 12.0–15.0)
Hemoglobin: 11.7 g/dL — ABNORMAL LOW (ref 12.0–15.0)
MCH: 30 pg (ref 26.0–34.0)
MCH: 30.3 pg (ref 26.0–34.0)
MCHC: 33 g/dL (ref 30.0–36.0)
MCHC: 33.1 g/dL (ref 30.0–36.0)
MCV: 91 fL (ref 80.0–100.0)
MCV: 91.5 fL (ref 80.0–100.0)
Platelets: 277 10*3/uL (ref 150–400)
Platelets: 289 10*3/uL (ref 150–400)
RBC: 4.1 MIL/uL (ref 3.87–5.11)
RBC: 3.86 MIL/uL — ABNORMAL LOW (ref 3.87–5.11)
RDW: 12.6 % (ref 11.5–15.5)
RDW: 12.8 % (ref 11.5–15.5)
WBC: 8.2 10*3/uL (ref 4.0–10.5)
WBC: 13.9 10*3/uL — ABNORMAL HIGH (ref 4.0–10.5)
nRBC: 0 % (ref 0.0–0.2)
nRBC: 0 % (ref 0.0–0.2)

## 2020-01-15 LAB — PROTEIN / CREATININE RATIO, URINE
Creatinine, Urine: 48.22 mg/dL
Total Protein, Urine: <6 mg/dL

## 2020-01-15 LAB — COMPREHENSIVE METABOLIC PANEL
ALT: 17 U/L (ref 0–44)
AST: 22 U/L (ref 15–41)
Albumin: 2.6 g/dL — ABNORMAL LOW (ref 3.5–5.0)
Alkaline Phosphatase: 175 U/L — ABNORMAL HIGH (ref 38–126)
Anion gap: 11 (ref 5–15)
BUN: <5 mg/dL — ABNORMAL LOW (ref 6–20)
CO2: 21 mmol/L — ABNORMAL LOW (ref 22–32)
Calcium: 8.6 mg/dL — ABNORMAL LOW (ref 8.9–10.3)
Chloride: 107 mmol/L (ref 98–111)
Creatinine, Ser: 0.61 mg/dL (ref 0.44–1.00)
GFR calc Af Amer: >60 mL/min (ref >60)
GFR calc non Af Amer: >60 mL/min (ref >60)
Glucose, Bld: 64 mg/dL — ABNORMAL LOW (ref 70–99)
Potassium: 3.4 mmol/L — ABNORMAL LOW (ref 3.5–5.1)
Sodium: 139 mmol/L (ref 135–145)
Total Bilirubin: 0.5 mg/dL (ref 0.3–1.2)
Total Protein: 6.4 g/dL — ABNORMAL LOW (ref 6.5–8.1)

## 2020-01-15 LAB — RESPIRATORY PANEL BY RT PCR (FLU A&B, COVID)
Influenza A by PCR: NEGATIVE
Influenza B by PCR: NEGATIVE
SARS Coronavirus 2 by RT PCR: NEGATIVE

## 2020-01-15 LAB — AMNISURE RUPTURE OF MEMBRANE (ROM) NOT AT ARMC: Amnisure ROM: NEGATIVE

## 2020-01-15 LAB — PREPARE RBC (CROSSMATCH)

## 2020-01-15 LAB — POCT FERN TEST: POCT Fern Test: POSITIVE

## 2020-01-15 MED ORDER — SODIUM CHLORIDE 0.9% IV SOLUTION: Freq: Once | INTRAVENOUS | Status: DC

## 2020-01-15 MED ORDER — LACTATED RINGERS IV SOLN
500.0000 mL | Freq: Once | INTRAVENOUS | Status: AC
  Administered 2020-01-15: 500 mL via INTRAVENOUS

## 2020-01-15 MED ORDER — ACETAMINOPHEN 325 MG PO TABS: 650.0000 mg | ORAL_TABLET | ORAL | Status: DC | PRN

## 2020-01-15 MED ORDER — LACTATED RINGERS IV SOLN: 500.0000 mL | INTRAVENOUS | Status: DC | PRN

## 2020-01-15 MED ORDER — LABETALOL HCL 5 MG/ML IV SOLN: 40.0000 mg | INTRAVENOUS | Status: DC | PRN

## 2020-01-15 MED ORDER — DIPHENHYDRAMINE HCL 50 MG/ML IJ SOLN: 12.5000 mg | INTRAMUSCULAR | Status: DC | PRN

## 2020-01-15 MED ORDER — EPHEDRINE 5 MG/ML INJ: 10.0000 mg | INTRAVENOUS | Status: DC | PRN

## 2020-01-15 MED ORDER — FENTANYL-BUPIVACAINE-NACL 0.5-0.125-0.9 MG/250ML-% EP SOLN
12.0000 mL/h | EPIDURAL | Status: DC | PRN
  Administered 2020-01-16: 12 mL/h via EPIDURAL
  Filled 2020-01-15 (×2): qty 250

## 2020-01-15 MED ORDER — LACTATED RINGERS IV SOLN: INTRAVENOUS | Status: DC

## 2020-01-15 MED ORDER — PHENYLEPHRINE 40 MCG/ML (10ML) SYRINGE FOR IV PUSH (FOR BLOOD PRESSURE SUPPORT): 80.0000 ug | PREFILLED_SYRINGE | INTRAVENOUS | Status: DC | PRN

## 2020-01-15 MED ORDER — OXYTOCIN BOLUS FROM INFUSION
500.0000 mL | Freq: Once | INTRAVENOUS | Status: AC
  Administered 2020-01-16: 500 mL via INTRAVENOUS

## 2020-01-15 MED ORDER — FLEET ENEMA 7-19 GM/118ML RE ENEM: 1.0000 | ENEMA | RECTAL | Status: DC | PRN

## 2020-01-15 MED ORDER — OXYTOCIN 40 UNITS IN NORMAL SALINE INFUSION - SIMPLE MED
2.5000 [IU]/h | INTRAVENOUS | Status: DC
  Administered 2020-01-16: 2.5 [IU]/h via INTRAVENOUS

## 2020-01-15 MED ORDER — HYDRALAZINE HCL 20 MG/ML IJ SOLN: 10.0000 mg | INTRAMUSCULAR | Status: DC | PRN

## 2020-01-15 MED ORDER — LABETALOL HCL 5 MG/ML IV SOLN: 20.0000 mg | INTRAVENOUS | Status: DC | PRN

## 2020-01-15 MED ORDER — SOD CITRATE-CITRIC ACID 500-334 MG/5ML PO SOLN: 30.0000 mL | ORAL | Status: DC | PRN

## 2020-01-15 MED ORDER — LIDOCAINE HCL (PF) 1 % IJ SOLN: 30.0000 mL | INTRAMUSCULAR | Status: DC | PRN

## 2020-01-15 MED ORDER — MISOPROSTOL 50MCG HALF TABLET
50.0000 ug | ORAL_TABLET | ORAL | Status: DC
  Administered 2020-01-15 (×2): 50 ug via BUCCAL
  Filled 2020-01-15 (×2): qty 1

## 2020-01-15 MED ORDER — FLEET ENEMA 7-19 GM/118ML RE ENEM
1.0000 | ENEMA | Freq: Once | RECTAL | Status: AC
  Administered 2020-01-15: 1 via RECTAL

## 2020-01-15 MED ORDER — ONDANSETRON HCL 4 MG/2ML IJ SOLN
4.0000 mg | Freq: Four times a day (QID) | INTRAMUSCULAR | Status: DC | PRN
  Administered 2020-01-15 – 2020-01-16 (×3): 4 mg via INTRAVENOUS
  Filled 2020-01-15 (×3): qty 2

## 2020-01-15 MED ORDER — LABETALOL HCL 5 MG/ML IV SOLN: 80.0000 mg | INTRAVENOUS | Status: DC | PRN

## 2020-01-15 MED ORDER — PHENYLEPHRINE 40 MCG/ML (10ML) SYRINGE FOR IV PUSH (FOR BLOOD PRESSURE SUPPORT)
80.0000 ug | PREFILLED_SYRINGE | INTRAVENOUS | Status: DC | PRN
  Filled 2020-01-15: qty 10

## 2020-01-15 MED ORDER — FENTANYL CITRATE (PF) 100 MCG/2ML IJ SOLN
100.0000 ug | INTRAMUSCULAR | Status: DC | PRN
  Administered 2020-01-15 (×2): 100 ug via INTRAVENOUS
  Filled 2020-01-15 (×2): qty 2

## 2020-01-15 NOTE — Progress Notes (Signed)
Discussed with patient placement of foley balloon for cervical ripening. Risks and benefits reviewed. Patient agreeable to plan of care. Foley balloon inserted without difficulty and inflated with 60cc sterile water. Patient tolerated procedure well. Will give buccal cytotec.   Rolm Bookbinder, CNM 01/15/20 4:39 PM

## 2020-01-15 NOTE — MAU Note (Signed)
Vertex presentation confirmed by [place] by cnm Crisoforo [place]

## 2020-01-15 NOTE — Anesthesia Preprocedure Evaluation (Signed)
Anesthesia Evaluation  Patient identified by MRN, date of birth, ID band Patient awake    Reviewed: Allergy & Precautions, NPO status , Patient's Chart, lab work & pertinent test results  Airway Mallampati: II  TM Distance: >3 FB Neck ROM: Full    Dental no notable dental hx. (+) Teeth Intact   Pulmonary neg pulmonary ROS,    Pulmonary exam normal breath sounds clear to auscultation       Cardiovascular Exercise Tolerance: Good hypertension, Normal cardiovascular exam Rhythm:Regular Rate:Normal     Neuro/Psych negative neurological ROS  negative psych ROS   GI/Hepatic negative GI ROS, Neg liver ROS,   Endo/Other  negative endocrine ROS  Renal/GU negative Renal ROS     Musculoskeletal negative musculoskeletal ROS (+)   Abdominal   Peds  Hematology Lab Results      Component                Value               Date                      WBC                      13.9 (H)            01/15/2020                HGB                      11.7 (L)            01/15/2020                HCT                      35.3 (L)            01/15/2020                MCV                      91.5                01/15/2020                PLT                      289                 01/15/2020             Anesthesia Other Findings   Reproductive/Obstetrics (+) Pregnancy                             Anesthesia Physical Anesthesia Plan  ASA: III  Anesthesia Plan: Epidural   Post-op Pain Management:    Induction:   PONV Risk Score and Plan:   Airway Management Planned:   Additional Equipment:   Intra-op Plan:   Post-operative Plan:   Informed Consent: I have reviewed the patients History and Physical, chart, labs and discussed the procedure including the risks, benefits and alternatives for the proposed anesthesia with the patient or authorized representative who has indicated his/her understanding and  acceptance.       Plan Discussed with:   Anesthesia Plan Comments: (37.5 wk G1P0 w gHTN for LEA)  Anesthesia Quick Evaluation  

## 2020-01-15 NOTE — Anesthesia Procedure Notes (Addendum)
Epidural Patient location during procedure: OB Start time: 01/15/2020 9:54 PM End time: 01/15/2020 10:20 PM  Staffing Anesthesiologist: Trevor Iha, MD Performed: anesthesiologist   Preanesthetic Checklist Completed: patient identified, IV checked, site marked, risks and benefits discussed, surgical consent, monitors and equipment checked, pre-op evaluation and timeout performed  Epidural Patient position: sitting Prep: DuraPrep and site prepped and draped Patient monitoring: continuous pulse ox and blood pressure Approach: midline Location: L3-L4 Injection technique: LOR air  Needle:  Needle type: Tuohy  Needle gauge: 17 G Needle length: 9 cm and 9 Needle insertion depth: 5 cm cm Catheter type: closed end flexible Catheter size: 19 Gauge Catheter at skin depth: 10 cm Test dose: negative  Assessment Events: blood not aspirated, injection not painful, no injection resistance, no paresthesia and negative IV test  Additional Notes Patient identified. Risks/Benefits/Options discussed with patient including but not limited to bleeding, infection, nerve damage, paralysis, failed block, incomplete pain control, headache, blood pressure changes, nausea, vomiting, reactions to medication both or allergic, itching and postpartum back pain. Confirmed with bedside nurse the patient's most recent platelet count. Confirmed with patient that they are not currently taking any anticoagulation, have any bleeding history or any family history of bleeding disorders. Patient expressed understanding and wished to proceed. All questions were answered. Sterile technique was used throughout the entire procedure. Please see nursing notes for vital signs. Test dose was given through epidural needle and negative prior to continuing to dose epidural or start infusion. Warning signs of high block given to the patient including shortness of breath, tingling/numbness in hands, complete motor block, or any  concerning symptoms with instructions to call for help. Patient was given instructions on fall risk and not to get out of bed. All questions and concerns addressed with instructions to call with any issues.  3 Attempt (S) . Patient tolerated procedure well.

## 2020-01-15 NOTE — MAU Note (Signed)
Candace Rivera is a 34 y.o. at [redacted]w[redacted]d here in MAU reporting: LOF for the past day. States last night it felt like it was just coming. States it is clear, wearing a pad. Changing the pad once every 2 hours. No pain or bleeding. +Fm  Onset of complaint: past day  Pain score: 0/10  Vitals:   01/15/20 1055  BP: 140/87  Pulse: 96  Resp: 16  Temp: 98.4 F (36.9 C)  SpO2: 100%     FHT: +FM  Lab orders placed from triage: none

## 2020-01-15 NOTE — Progress Notes (Signed)
Candace Rivera is a 34 y.o. G1P0 at [redacted]w[redacted]d admitted for IOL 2/2 new onset gHTN  Subjective: Feeling contractions more, also constipated  Objective: BP 140/82   Pulse 80   Temp 98.9 F (37.2 C) (Axillary)   Resp 15   Ht 5\' 5"  (1.651 m)   Wt 62.2 kg   LMP 04/26/2019   SpO2 100% Comment: room air  BMI 22.81 kg/m  No intake/output data recorded.  FHT:  FHR: 120 bpm, variability: moderate,  accelerations:  Present,  decelerations:  Absent UC:   irregular, every 2-5 minutes  SVE:   Dilation: 4.5 Effacement (%): 80 Station: -1 Exam by:: Dr. [email or web address]  Labs: Lab Results  Component Value Date   WBC 8.2 01/15/2020   HGB 12.3 01/15/2020   HCT 37.3 01/15/2020   MCV 91.0 01/15/2020   PLT 277 01/15/2020    Assessment / Plan: 34 yo G1P0 at 37.5 EGA here for IOL 2/2 new onset gHTN  #Labor: s/p 1 dose cytotec and FB. Cervix still firm/moderately firm. Bishop score 6, will give another cytotec. Plan to consider Pitocin when cervix is more favorable. #Pain:  Planning epidural #FWB: Cat 1 #ID:      GBS neg #MOF: both #MOC: nexplanon  #Circ:   yes #Constipation: copious amount of stool in rectal vault. Patient requesting medical assistance for bowel movement. Fleet enema administered with RN as chaperone #gHTN: no severe range pressures. Pre-E labs normal. No sxs Pre-E. Continue to monitor  38 DO OB Fellow, Faculty Practice 01/15/2020, 9:22 PM

## 2020-01-15 NOTE — H&P (Signed)
OBSTETRIC ADMISSION HISTORY AND PHYSICAL  Candace Rivera is a 34 y.o. female G1P0 with IUP at [redacted]w[redacted]d by LMP presenting for IOL for gestational hypertension. She reports +FMs, No LOF, no VB, no blurry vision, headaches or peripheral edema, and RUQ pain.  She plans on Breast and bottle feeding. She request Nexplanon for birth control. She received her [facility]   Dating: By LMP --->  Estimated Date of Delivery: 01/31/20   Nursing Staff Provider  Office Location  [facility] Dating  LMP consistent with 7 week [place]  Language   English Anatomy US   WNL  Flu Vaccine   07/26/19 Genetic Screen  NIPS: LR  AFP:     TDaP vaccine   11/09/2019 Hgb A1C or  GTT Early  Third trimester [id number]  Rhogam   NA   LAB RESULTS   Feeding Plan  Breast Blood Type A/Positive/-- (11/09 1236)   Contraception  Nexplanon Antibody Positive, See Final Results (11/09 1236)  Circumcision  Yes, inpatient  Rubella 1.64 (11/09 1236)  Pediatrician   List given RPR Non Reactive (11/09 1236)   Support Person  Significant  Other HBsAg Negative (11/09 1236)   Prenatal Classes  offered HIV Non Reactive (11/09 1236)  BTL Consent  NA GBS  NEGATIVE  VBAC Consent  NA Pap HSIL - colpo 08/23/19    Hgb Electro   Negative  BP Cuff  BP cuff  ordered 08/23/19 CF  Negative    SMA  Negative    Waterbirth  [ ]  Class [ ]  Consent [ ]  CNM visit     Prenatal History/Complications: n/a  Past Medical History: Past Medical History:  Diagnosis Date  . Appendicitis   . HSIL on Pap smear of cervix     Past Surgical History: Past Surgical History:  Procedure Laterality Date  . ABSCESS DRAINAGE    . APPENDECTOMY    . CYST REMOVAL NECK  2018    Obstetrical History: OB History    Gravida  1   Para      Term      Preterm      AB      Living        SAB      TAB      Ectopic      Multiple      Live Births              Social History Social History   Socioeconomic History  . Marital status: Single   Spouse name: Not on file  . Number of children: Not on file  . Years of education: Not on file  . Highest education level: Not on file  Occupational History  . Not on file  Tobacco Use  . Smoking status: Never Smoker  . Smokeless tobacco: Never Used  Substance and Sexual Activity  . Alcohol use: Not Currently    Comment: occ  . Drug use: No  . Sexual activity: Not Currently    Birth control/protection: None  Other Topics Concern  . Not on file  Social History Narrative  . Not on file   Social Determinants of Health   Financial Resource Strain:   . Difficulty of Paying Living Expenses:   Food Insecurity: No Food Insecurity  . Worried About in the Last Year: Never true  . Ran Out of Food in the Last Year: Never true  Transportation Needs: No Transportation Needs  . Lack of Transportation (Medical):  No  . Lack of Transportation (Non-Medical): No  Physical Activity:   . Days of Exercise per Week:   . Minutes of Exercise per Session:   Stress:   . Feeling of Stress :   Social Connections:   . Frequency of Communication with Friends and Family:   . Frequency of Social Gatherings with Friends and Family:   . Attends Religious Services:   . Active Member of Clubs or Organizations:   . Attends Banker Meetings:   Marland Kitchen Marital Status:     Family History: Family History  Problem Relation Age of Onset  . Heart disease Father   . Hypertension Father   . Diabetes Father     Allergies: Allergies  Allergen Reactions  . Codeine Nausea Only  . Oxycodone Nausea Only    Medications Prior to Admission  Medication Sig Dispense Refill Last Dose  . Elastic Bandages & Supports (COMFORT FIT MATERNITY SUPP MED) MISC 1 Units by Does not apply route daily. 1 each 0   . Prenatal Vit-Fe Fumarate-FA (MULTIVITAMIN-PRENATAL) 27-0.8 MG TABS tablet Take 1 tablet by mouth daily at 12 noon.        Review of Systems   All systems reviewed and negative except  as stated in HPI  Blood pressure (!) 152/105, pulse 91, temperature 98.4 F (36.9 C), temperature source Oral, resp. rate 16, height 5\' 5"  (1.651 m), weight 62.2 kg, last menstrual period 04/26/2019, SpO2 100 %. General appearance: alert, cooperative and no distress Lungs: clear to auscultation bilaterally Heart: regular rate and rhythm Abdomen: soft, non-tender; bowel sounds normal Pelvic: n/a Extremities: Homans sign is negative, no sign of DVT DTR's +2 Presentation: cephalic Fetal monitoringBaseline: 130 bpm, Variability: Good {> 6 bpm), Accelerations: Reactive and Decelerations: Absent Uterine activity: intermittent uc's     Prenatal labs: ABO, Rh: A/Positive/-- (11/09 1236) Antibody: Positive, See Final Results (11/09 1236) Rubella: 1.64 (11/09 1236) RPR: Non Reactive (02/23 0914)  HBsAg: Negative (02/23 0914)  HIV: Non Reactive (02/23 0914)  GBS: Negative/-- (04/20 1120)   Prenatal Transfer Tool  Maternal Diabetes: No Genetic Screening: Normal Maternal Ultrasounds/Referrals: Normal Fetal Ultrasounds or other Referrals:  None Maternal Substance Abuse:  No Significant Maternal Medications:  None Significant Maternal Lab Results: Group B Strep negative  Results for orders placed or performed during the hospital encounter of 01/15/20 (from the past 24 hour(s))  CBC   Collection Time: 01/15/20 11:45 AM  Result Value Ref Range   WBC 8.2 4.0 - 10.5 K/uL   RBC 4.10 3.87 - 5.11 MIL/uL   Hemoglobin 12.3 12.0 - 15.0 g/dL   HCT 03/16/20 08.6 - 57.8 %   MCV 91.0 80.0 - 100.0 fL   MCH 30.0 26.0 - 34.0 pg   MCHC 33.0 30.0 - 36.0 g/dL   RDW 46.9 62.9 - 52.8 %   Platelets 277 150 - 400 K/uL   nRBC 0.0 0.0 - 0.2 %  Comprehensive metabolic panel   Collection Time: 01/15/20 11:45 AM  Result Value Ref Range   Sodium 139 135 - 145 mmol/L   Potassium 3.4 (L) 3.5 - 5.1 mmol/L   Chloride 107 98 - 111 mmol/L   CO2 21 (L) 22 - 32 mmol/L   Glucose, Bld 64 (L) 70 - 99 mg/dL   BUN <5 (L)  6 - 20 mg/dL   Creatinine, Ser 03/16/20 0.44 - 1.00 mg/dL   Calcium 8.6 (L) 8.9 - 10.3 mg/dL   Total Protein 6.4 (L) 6.5 - 8.1 g/dL  Albumin 2.6 (L) 3.5 - 5.0 g/dL   AST 22 15 - 41 U/L   ALT 17 0 - 44 U/L   Alkaline Phosphatase 175 (H) 38 - 126 U/L   Total Bilirubin 0.5 0.3 - 1.2 mg/dL   GFR calc non Af Amer >60 >60 mL/min   GFR calc Af Amer >60 >60 mL/min   Anion gap 11 5 - 15  Protein / creatinine ratio, urine   Collection Time: 01/15/20 12:30 PM  Result Value Ref Range   Creatinine, Urine 48.22 mg/dL   Total Protein, Urine <6 mg/dL   Protein Creatinine Ratio        0.00 - 0.15 mg/mg[Cre]  Amnisure rupture of membrane (rom)not at [facility]   Collection Time: 01/15/20 12:30 PM  Result Value Ref Range   Amnisure ROM NEGATIVE   Fern Test   Collection Time: 01/15/20  1:21 PM  Result Value Ref Range   POCT Fern Test Positive = ruptured amniotic membanes     Patient Active Problem List   Diagnosis Date Noted  . HSIL (high grade squamous intraepithelial lesion) on Pap smear of cervix 07/30/2019  . 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 Supervision of low-risk pregnancy 07/12/2019  . 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 Self-inflicted laceration of wrist ([place]) 02/17/2018    Assessment/Plan:  Tearsa Kowalewski is a 34 y.o. G1P0 at [redacted]w[redacted]d here for IOL gHTN  #Labor: plan cytotec and FB when able #Pain: Planning epidural #FWB: Cat 1 #ID:  GBS neg #MOF: both #MOC: nexplanon  #Circ:  yes  Wende Mott, CNM  01/15/2020, 3:19 PM

## 2020-01-16 DIAGNOSIS — Z3A37 37 weeks gestation of pregnancy: Secondary | ICD-10-CM

## 2020-01-16 LAB — RPR: RPR Ser Ql: NONREACTIVE

## 2020-01-16 MED ORDER — ONDANSETRON HCL 4 MG/2ML IJ SOLN: 4.0000 mg | INTRAMUSCULAR | Status: DC | PRN

## 2020-01-16 MED ORDER — IBUPROFEN 600 MG PO TABS
600.0000 mg | ORAL_TABLET | Freq: Three times a day (TID) | ORAL | Status: DC | PRN
  Administered 2020-01-16 – 2020-01-18 (×5): 600 mg via ORAL
  Filled 2020-01-16 (×5): qty 1

## 2020-01-16 MED ORDER — COCONUT OIL OIL: 1.0000 "application " | TOPICAL_OIL | Status: DC | PRN

## 2020-01-16 MED ORDER — TETANUS-DIPHTH-ACELL PERTUSSIS 5-2.5-18.5 LF-MCG/0.5 IM SUSP: 0.5000 mL | Freq: Once | INTRAMUSCULAR | Status: DC

## 2020-01-16 MED ORDER — TERBUTALINE SULFATE 1 MG/ML IJ SOLN: 0.2500 mg | Freq: Once | INTRAMUSCULAR | Status: DC | PRN

## 2020-01-16 MED ORDER — MEASLES, MUMPS & RUBELLA VAC IJ SOLR: 0.5000 mL | Freq: Once | INTRAMUSCULAR | Status: DC

## 2020-01-16 MED ORDER — OXYTOCIN 40 UNITS IN NORMAL SALINE INFUSION - SIMPLE MED
1.0000 m[IU]/min | INTRAVENOUS | Status: DC
  Administered 2020-01-16: 2 m[IU]/min via INTRAVENOUS
  Filled 2020-01-16: qty 1000

## 2020-01-16 MED ORDER — ONDANSETRON HCL 4 MG PO TABS: 4.0000 mg | ORAL_TABLET | ORAL | Status: DC | PRN

## 2020-01-16 MED ORDER — ACETAMINOPHEN 325 MG PO TABS
650.0000 mg | ORAL_TABLET | Freq: Four times a day (QID) | ORAL | Status: DC | PRN
  Administered 2020-01-16: 650 mg via ORAL
  Filled 2020-01-16: qty 2

## 2020-01-16 MED ORDER — POLYETHYLENE GLYCOL 3350 17 G PO PACK
17.0000 g | PACK | Freq: Two times a day (BID) | ORAL | Status: DC
  Administered 2020-01-17 (×2): 17 g via ORAL
  Filled 2020-01-16 (×3): qty 1

## 2020-01-16 MED ORDER — WITCH HAZEL-GLYCERIN EX PADS
1.0000 "application " | MEDICATED_PAD | CUTANEOUS | Status: DC | PRN
  Administered 2020-01-16: 1 via TOPICAL

## 2020-01-16 MED ORDER — SENNOSIDES-DOCUSATE SODIUM 8.6-50 MG PO TABS
2.0000 | ORAL_TABLET | Freq: Two times a day (BID) | ORAL | Status: DC
  Administered 2020-01-17 – 2020-01-18 (×4): 2 via ORAL
  Filled 2020-01-16 (×4): qty 2

## 2020-01-16 MED ORDER — BENZOCAINE-MENTHOL 20-0.5 % EX AERO
1.0000 "application " | INHALATION_SPRAY | CUTANEOUS | Status: DC | PRN
  Administered 2020-01-16: 1 via TOPICAL
  Filled 2020-01-16: qty 56

## 2020-01-16 MED ORDER — DIPHENHYDRAMINE HCL 25 MG PO CAPS: 25.0000 mg | ORAL_CAPSULE | Freq: Four times a day (QID) | ORAL | Status: DC | PRN

## 2020-01-16 MED ORDER — PRENATAL MULTIVITAMIN CH
1.0000 | ORAL_TABLET | Freq: Every day | ORAL | Status: DC
  Administered 2020-01-17: 1 via ORAL
  Filled 2020-01-16: qty 1

## 2020-01-16 MED ORDER — SIMETHICONE 80 MG PO CHEW: 80.0000 mg | CHEWABLE_TABLET | ORAL | Status: DC | PRN

## 2020-01-16 MED ORDER — DIBUCAINE (PERIANAL) 1 % EX OINT: 1.0000 "application " | TOPICAL_OINTMENT | CUTANEOUS | Status: DC | PRN

## 2020-01-16 NOTE — Progress Notes (Signed)
Candace Rivera is a 34 y.o. G1P0 at [redacted]w[redacted]d admitted for IOL 2/2 new onset gHTN.  Subjective: Feeling pressure in her bottom. Has been pushing for 1 hour and 45 minutes.  Objective: BP 126/80   Pulse 91   Temp 98.1 F (36.7 C) (Oral)   Resp 18   Ht 5\' 5"  (1.651 m)   Wt 62.2 kg   LMP 04/26/2019   SpO2 99%   BMI 22.81 kg/m  Total I/O In: -  Out: 600 [Urine:600]  FHT:  FHR: 145 bpm, moderate variability, pos accels, variable decels with pushing only UC:   regular, q2-32m  SVE:   Dilation: 10 Effacement (%): 100 Station: 0 Exam by:: s grindstaff rn   Labs: Lab Results  Component Value Date   WBC 13.9 (H) 01/15/2020   HGB 11.7 (L) 01/15/2020   HCT 35.3 (L) 01/15/2020   MCV 91.5 01/15/2020   PLT 289 01/15/2020    Assessment / Plan: 34 yo G1P0 at 37.6 EGA here for IOL 2/2 new onset gHTN.  #Labor:s/p FB, 2 doses of cytotec, SROM. Cont Pitocin. Patient has been complete and pushing for 1 hour and 45 minutes. +1/+2 station. Will labor down and restart. Have pushed with patient several times in multiple positions. Effort moderate. Did discuss possible need for vacuum if no descent when pushing resumes. Anticipate SVD. #Pain: epidural #FWB:Cat I #ID:GBS neg #gHTN: no severe range pressures. Pre-E labs normal. No sxs Pre-E. Continue to monitor  38, MD OB Fellow, Faculty Practice 01/16/2020, 1:14 PM

## 2020-01-16 NOTE — Progress Notes (Signed)
Pulse ox applied.

## 2020-01-16 NOTE — Progress Notes (Signed)
Candace Rivera is a 34 y.o. G1P0 at 47w6dadmitted for IOL 2/2 new onset gHTN.  Subjective: Met patient and discussed plan. Feeling some pain in her back and pressure in her bottom.   Objective: BP (!) 147/90   Pulse 87   Temp 98.2 F (36.8 C) (Axillary)   Resp 19   Ht '5\' 5"'$  (1.651 m)   Wt 62.2 kg   LMP 04/26/2019   SpO2 100% Comment: room air  BMI 22.81 kg/m  No intake/output data recorded.  FHT:  FHR: 155 bpm, moderate variability, pos accels, no decels, reactive UC:   regular, q2-352mSVE:   Dilation: 8.5 Effacement (%): 90 Station: 0 Exam by:: Dr FaMarice PotterLabs: Lab Results  Component Value Date   WBC 13.9 (H) 01/15/2020   HGB 11.7 (L) 01/15/2020   HCT 35.3 (L) 01/15/2020   MCV 91.5 01/15/2020   PLT 289 01/15/2020    Assessment / Plan: [age]o G1P0 at 37.6 EGA here for IOL 2/2 new onset gHTN.  #Labor:s/p FB, 2 doses of cytotec, SROM. Cont Pitocin. Feels warm, will start Triple I abx if maternal fever. No cervical change with this exam. Baby is ROP. Attempted manual rotation and placed in hands and knees. Anticipate SVD. #Pain: epidural #FWB:Cat I #ID:GBS neg #gHTN: no severe range pressures. Pre-E labs normal. No sxs Pre-E. Continue to monitor  ChChauncey MannMD OB Fellow, Faculty Practice 01/16/2020, 9:02 AM

## 2020-01-16 NOTE — Progress Notes (Signed)
Candace Rivera is a 34 y.o. G1P0 at [redacted]w[redacted]d admitted for IOL 2/2 new onset gHTN  Subjective: Comfortable with epidural, thinks she SROMed  Objective: BP 127/73   Pulse 80   Temp 98.4 F (36.9 C) (Oral)   Resp 17   Ht 5\' 5"  (1.651 m)   Wt 62.2 kg   LMP 04/26/2019   SpO2 100% Comment: room air  BMI 22.81 kg/m  No intake/output data recorded.  FHT:  FHR: 135 bpm, variability: moderate,  accelerations:  Present,  decelerations:  Absent UC:   regular, every 2-3 minutes  SVE:   Dilation: 6 Effacement (%): 80 Station: -1 Exam by:: [email or web address], RN  Labs: Lab Results  Component Value Date   WBC 13.9 (H) 01/15/2020   HGB 11.7 (L) 01/15/2020   HCT 35.3 (L) 01/15/2020   MCV 91.5 01/15/2020   PLT 289 01/15/2020    Assessment / Plan: 34 yo G1P0 at 37.6 EGA here for IOL 2/2 new onset gHTN  #Labor:s/p FB and 2 doses of cytotec. Probable SROM. Consider Pitocin if needed for augmentation #Pain:Comfortable with epidural #FWB:Cat 1 #ID:GBS neg #MOF:both #MOC:nexplanon #Circ:yes #Constipation: s/p fleet enema with large bowel movement, patient much more comfortable #gHTN: no severe range pressures. Pre-E labs normal. No sxs Pre-E. Continue to monitor  38 DO OB Fellow, Faculty Practice 01/16/2020, 3:09 AM

## 2020-01-16 NOTE — Discharge Summary (Signed)
Postpartum Discharge Summary     Patient Name: Candace Rivera DOB: 1985-12-10 MRN: [id number]  Date of admission: 01/15/2020 Delivering Provider: Chauncey Mann   Date of discharge: 01/18/2020  Admitting diagnosis: Gestational hypertension [O13.9] Intrauterine pregnancy: [redacted]w[redacted]d    Secondary diagnosis:  Active Problems:   Supervision of low-risk pregnancy   Anti-Lewis a Ab   HSIL (high grade squamous intraepithelial lesion) on Pap smear of cervix   Gestational hypertension   Vacuum-assisted vaginal delivery   Type 3c perineal laceration  Additional problems: None     Discharge diagnosis: Term Pregnancy Delivered and Gestational Hypertension                                                                                                Post partum procedures: DMPA injection  Augmentation: Pitocin, Cytotec and Foley Balloon  Complications: Vacuum-assisted and 3c perineal laceration  Hospital course:  Induction of Labor With Vaginal Delivery   34y.o. yo G1P0 at 311w6das admitted to the hospital 01/15/2020 for induction of labor.  Indication for induction: Gestational hypertension.  Patient had an uncomplicated labor course as follows: Initial SVE: 1/60/-2. Patient received Cytotec, Foley balloon and Pitocin. SROM occurred. Received epidural. She then progressed to complete. Patient was complete for almost 4 hours and pushed for about 2.5 hours with good effort but little descent. Mother reported exhaustion and desired assistance. Delivery by vacuum assistance done. Membrane Rupture Time/Date: 3:06 AM ,01/16/2020   Intrapartum Procedures: Episiotomy: None [1]                                         Lacerations:  3rd degree [4]  Patient had delivery of a Viable infant.  Information for the patient's newborn:  SpHennie, Gosa0[[id number]]Delivery Method: Vag-Vacuum    01/16/2020  Details of delivery can be found in separate delivery note. 3c perineal laceration. Patient was  started on bowel regimen and appt requested in clinic for laceration check. BP's monitored post-partum and found to be borderline/labile and so Procardia XL '30mg'$  was started. Patient had a routine postpartum course. Patient is discharged home 01/18/20. Delivery time: 2:59 PM    Magnesium Sulfate received: No BMZ received: No Rhophylac:N/A MMR:N/A Transfusion:No  Physical exam  Vitals:   01/17/20 1154 01/17/20 1448 01/17/20 2135 01/18/20 0524  BP: 127/89 116/75 121/82 113/67  Pulse: 74 96 89 90  Resp:  '18 18 16  '$ Temp:  98.4 F (36.9 C) 97.9 F (36.6 C) 98.4 F (36.9 C)  TempSrc:  Oral Oral Oral  SpO2:  100%  99%  Weight:      Height:       General: alert and cooperative Lochia: appropriate Uterine Fundus: firm Incision: N/A DVT Evaluation: No evidence of DVT seen on physical exam. Labs: Lab Results  Component Value Date   WBC 13.9 (H) 01/15/2020   HGB 11.7 (L) 01/15/2020   HCT 35.3 (L) 01/15/2020   MCV 91.5 01/15/2020   PLT 289 01/15/2020  CMP Latest Ref Rng & Units 01/15/2020  Glucose 70 - 99 mg/dL 64(L)  BUN 6 - 20 mg/dL <5(L)  Creatinine 0.44 - 1.00 mg/dL 0.61  Sodium 135 - 145 mmol/L 139  Potassium 3.5 - 5.1 mmol/L 3.4(L)  Chloride 98 - 111 mmol/L 107  CO2 22 - 32 mmol/L 21(L)  Calcium 8.9 - 10.3 mg/dL 8.6(L)  Total Protein 6.5 - 8.1 g/dL 6.4(L)  Total Bilirubin 0.3 - 1.2 mg/dL 0.5  Alkaline Phos 38 - 126 U/L 175(H)  AST 15 - 41 U/L 22  ALT 0 - 44 U/L 17   Edinburgh Score: Edinburgh Postnatal Depression Scale Screening Tool 01/17/2020  I have been able to laugh and see the funny side of things. 0  I have looked forward with enjoyment to things. 0  I have blamed myself unnecessarily when things went wrong. 0  I have been anxious or worried for no good reason. 0  I have felt scared or panicky for no good reason. 0  Things have been getting on top of me. 1  I have been so unhappy that I have had difficulty sleeping. 0  I have felt sad or miserable. 0  I  have been so unhappy that I have been crying. 0  The thought of harming myself has occurred to me. 0  Edinburgh Postnatal Depression Scale Total 1    Discharge instruction: per After Visit Summary and "Baby and Me Booklet".  After visit meds:  Allergies as of 01/18/2020      Reactions   Codeine Nausea Only   Oxycodone Nausea Only      Medication List    STOP taking these medications   [place] these medications   ibuprofen 600 MG tablet Commonly known as: ADVIL Take 1 tablet (600 mg total) by mouth every 6 (six) hours as needed for mild pain.   multivitamin-prenatal 27-0.8 MG Tabs tablet Take 1 tablet by mouth daily at 12 noon.   NIFEdipine 30 MG 24 hr tablet Commonly known as: ADALAT CC Take 1 tablet (30 mg total) by mouth daily. Start taking on: Jan 19, 2020       Diet: routine diet  Activity: Advance as tolerated. Pelvic rest for 6 weeks.   Outpatient follow up:BP & lac check in 1wk; PP visit in 4wks; LEEP in 8wks Follow up Appt: No future appointments. Follow up Visit:   Please schedule this patient for Postpartum visit in: 4 weeks with the following provider: Any provider Virtual For C/S patients schedule nurse incision check in weeks 2 weeks: no High risk pregnancy complicated by: HTN Delivery mode:  Vacuum Anticipated Birth Control:  Nexplanon PP Procedures needed: BP check and 3c perineal laceration check  Schedule Integrated BH visit: no   Newborn Data: Live born female  Birth Weight: 3150gm (6lb 15.1oz)  APGAR: 67, 9  Newborn Delivery   Birth date/time: 01/16/2020 14:59:00 Delivery type: Vaginal, Vacuum (Extractor)      Baby Feeding: Bottle Disposition:home with mother   01/18/2020 Myrtis Ser, CNM  11:51 AM

## 2020-01-16 NOTE — Plan of Care (Signed)
Pt demonstrated understanding 

## 2020-01-16 NOTE — Progress Notes (Signed)
Candace Rivera is a 34 y.o. G1P0 at [redacted]w[redacted]d admitted for IOL 2/2 new onset gHTN  Subjective: Feeling more pressure in her bottom  Objective: BP 140/87   Pulse 92   Temp 98.4 F (36.9 C) (Oral)   Resp 16   Ht 5\' 5"  (1.651 m)   Wt 62.2 kg   LMP 04/26/2019   SpO2 100% Comment: room air  BMI 22.81 kg/m  No intake/output data recorded.  FHT:  FHR: 150 bpm, variability: moderate,  accelerations:  Present,  decelerations:  Absent UC:   regular, every 2-3 minutes  SVE:   Dilation: 8.5 Effacement (%): 90 Station: 0 Exam by:: [email or web address], RN  Labs: Lab Results  Component Value Date   WBC 13.9 (H) 01/15/2020   HGB 11.7 (L) 01/15/2020   HCT 35.3 (L) 01/15/2020   MCV 91.5 01/15/2020   PLT 289 01/15/2020    Assessment / Plan: 34 yo G1P0 at 37.6 EGA here for IOL 2/2 new onset gHTN  #Labor:s/p FB, 2 doses of cytotec, SROM. Minimal change in the last few hours, start Pitocin. #Pain:Comfortable with epidural #FWB:Cat 1 #ID:GBS neg #MOF:both #MOC:nexplanon #Circ:yes #Constipation: s/p fleet enema with large bowel movement, patient much more comfortable #gHTN: no severe range pressures. Pre-E labs normal. No sxs Pre-E. Continue to monitor  38 DO OB Fellow, [facility] 01/16/2020, 6:25 AM

## 2020-01-17 ENCOUNTER — Encounter (HOSPITAL_COMMUNITY): Payer: Self-pay | Admitting: Obstetrics and Gynecology

## 2020-01-17 MED ORDER — NIFEDIPINE ER OSMOTIC RELEASE 30 MG PO TB24
30.0000 mg | ORAL_TABLET | Freq: Every day | ORAL | Status: DC
  Administered 2020-01-17 – 2020-01-18 (×2): 30 mg via ORAL
  Filled 2020-01-17 (×2): qty 1

## 2020-01-17 MED ORDER — ETONOGESTREL 68 MG ~~LOC~~ IMPL: 68.0000 mg | DRUG_IMPLANT | Freq: Once | SUBCUTANEOUS | Status: DC

## 2020-01-17 MED ORDER — LIDOCAINE HCL 1 % IJ SOLN: 0.0000 mL | Freq: Once | INTRAMUSCULAR | Status: DC | PRN

## 2020-01-17 MED ORDER — MEDROXYPROGESTERONE ACETATE 150 MG/ML IM SUSP
150.0000 mg | Freq: Once | INTRAMUSCULAR | Status: AC
  Administered 2020-01-17: 150 mg via INTRAMUSCULAR
  Filled 2020-01-17: qty 1
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
## 2020-01-17 NOTE — Progress Notes (Addendum)
Post Partum Day 1 Subjective: no complaints, up ad lib, voiding, tolerating PO and + flatus  Objective: Blood pressure (!) 123/95, pulse 86, temperature 97.6 F (36.4 C), resp. rate 18, height 5\' 5"  (1.651 m), weight 62.2 kg, last menstrual period 04/26/2019, SpO2 100 %.  Physical Exam:  General: alert, cooperative and happy and in good spirits Lochia: appropriate Uterine Fundus: firm DVT Evaluation: No evidence of DVT seen on physical exam. No cords or calf tenderness. No significant calf/ankle edema.  Recent Labs    01/15/20 1145 01/15/20 2055  HGB 12.3 11.7*  HCT 37.3 35.3*    Assessment/Plan: Plan for discharge tomorrow Circumcision prior to discharge Feed: Bottle Contraception: Nexplanon   LOS: 2 days   2056, MS3 01/17/2020, 7:52 AM    OB FELLOW ATTESTATION  I have seen and examined this patient, repeated all portions of the history and physical exam, and agree with note except as below.  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

## 2020-01-17 NOTE — Anesthesia Postprocedure Evaluation (Signed)
Anesthesia Post Note  Patient: [facility]  Procedure(s) Performed: AN AD HOC LABOR EPIDURAL     Patient location during evaluation: Mother Baby Anesthesia Type: Epidural Level of consciousness: awake and alert and oriented Pain management: satisfactory to patient Vital Signs Assessment: post-procedure vital signs reviewed and stable Respiratory status: respiratory function stable Cardiovascular status: stable Postop Assessment: no headache, no backache, epidural receding, patient able to bend at knees, no signs of nausea or vomiting, adequate PO intake, no apparent nausea or vomiting and able to ambulate Anesthetic complications: no    Last Vitals:  Vitals:   01/17/20 1154 01/17/20 1448  BP: 127/89 116/75  Pulse: 74 96  Resp:  18  Temp:  36.9 C  SpO2:  100%    Last Pain:  Vitals:   01/17/20 1507  TempSrc:   PainSc: 0-No pain   Pain Goal: Patients Stated Pain Goal: 8 (01/15/20 1642)                 Vinicius Brockman

## 2020-01-18 ENCOUNTER — Encounter: Payer: Medicaid Other | Admitting: Advanced Practice Midwife

## 2020-01-18 MED ORDER — IBUPROFEN 600 MG PO TABS: 600.0000 mg | ORAL_TABLET | Freq: Four times a day (QID) | ORAL | 0 refills | Status: AC | PRN

## 2020-01-18 MED ORDER — NIFEDIPINE ER 30 MG PO TB24: 30.0000 mg | ORAL_TABLET | Freq: Every day | ORAL | 2 refills | Status: DC

## 2020-01-18 MED FILL — NIFEdipine ER 30 MG TB24: 30 | 30 days supply | Qty: 30 | Fill #0

## 2020-01-18 MED FILL — IBUPROFEN 600 MG TABLET: 600 | 15 days supply | Qty: 30 | Fill #0

## 2020-01-18 NOTE — Discharge Instructions (Signed)
Postpartum Hypertension Postpartum hypertension is high blood pressure that remains higher than normal after childbirth. You may not realize that you have postpartum hypertension if your blood pressure is not being checked regularly. In most cases, postpartum hypertension will go away on its own, usually within a week of delivery. However, for some women, medical treatment is required to prevent serious complications, such as seizures or stroke. What are the causes? This condition may be caused by one or more of the following:  Hypertension that existed before pregnancy (chronic hypertension).  Hypertension that comes on as a result of pregnancy (gestational hypertension).  Hypertensive disorders during pregnancy (preeclampsia) or seizures in women who have high blood pressure during pregnancy (eclampsia).  A condition in which the liver, platelets, and red blood cells are damaged during pregnancy (HELLP syndrome).  A condition in which the thyroid produces too much hormones (hyperthyroidism).  Other rare problems of the nerves (neurological disorders) or blood disorders. In some cases, the cause may not be known. What increases the risk? The following factors may make you more likely to develop this condition:  Chronic hypertension. In some cases, this may not have been diagnosed before pregnancy.  Obesity.  Type 2 diabetes.  Kidney disease.  History of preeclampsia or eclampsia.  Other medical conditions that change the level of hormones in the body (hormonal imbalance). What are the signs or symptoms? As with all types of hypertension, postpartum hypertension may not have any symptoms. Depending on how high your blood pressure is, you may experience:  Headaches. These may be mild, moderate, or severe. They may also be steady, constant, or sudden in onset (thunderclap headache).  Changes in your ability to see (visual changes).  Dizziness.  Shortness of breath.  Swelling  of your hands, feet, lower legs, or face. In some cases, you may have swelling in more than one of these locations.  Heart palpitations or a racing heartbeat.  Difficulty breathing while lying down.  Decrease in the amount of urine that you pass. Other rare signs and symptoms may include:  Sweating more than usual. This lasts longer than a few days after delivery.  Chest pain.  Sudden dizziness when you get up from sitting or lying down.  Seizures.  Nausea or vomiting.  Abdominal pain. How is this diagnosed? This condition may be diagnosed based on the results of a physical exam, blood pressure measurements, and blood and urine tests. You may also have other tests, such as a CT scan or an MRI, to check for other problems of postpartum hypertension. How is this treated? If blood pressure is high enough to require treatment, your options may include:  Medicines to reduce blood pressure (antihypertensives). Tell your health care provider if you are breastfeeding or if you plan to breastfeed. There are many antihypertensive medicines that are safe to take while breastfeeding.  Stopping medicines that may be causing hypertension.  Treating medical conditions that are causing hypertension.  Treating the complications of hypertension, such as seizures, stroke, or kidney problems. Your health care provider will also continue to monitor your blood pressure closely until it is within a safe range for you. Follow these instructions at home:  Take over-the-counter and prescription medicines only as told by your health care provider.  Return to your normal activities as told by your health care provider. Ask your health care provider what activities are safe for you.  Do not use any products that contain nicotine or tobacco, such as cigarettes and e-cigarettes. If   you need help quitting, ask your health care provider.  Keep all follow-up visits as told by your health care provider. This  is important. Contact a health care provider if:  Your symptoms get worse.  You have new symptoms, such as: ? A headache that does not get better. ? Dizziness. ? Visual changes. Get help right away if:  You suddenly develop swelling in your hands, ankles, or face.  You have sudden, rapid weight gain.  You develop difficulty breathing, chest pain, racing heartbeat, or heart palpitations.  You develop severe pain in your abdomen.  You have any symptoms of a stroke. "BE FAST" is an easy way to remember the main warning signs of a stroke: ? B - Balance. Signs are dizziness, sudden trouble walking, or loss of balance. ? E - Eyes. Signs are trouble seeing or a sudden change in vision. ? F - Face. Signs are sudden weakness or numbness of the face, or the face or eyelid drooping on one side. ? A - Arms. Signs are weakness or numbness in an arm. This happens suddenly and usually on one side of the body. ? S - Speech. Signs are sudden trouble speaking, slurred speech, or trouble understanding what people say. ? T - Time. Time to call emergency services. Write down what time symptoms started.  You have other signs of a stroke, such as: ? A sudden, severe headache with no known cause. ? Nausea or vomiting. ? Seizure. These symptoms may represent a serious problem that is an emergency. Do not wait to see if the symptoms will go away. Get medical help right away. Call your local emergency services (911 in the [place] Do not drive yourself to the hospital. Summary  Postpartum hypertension is high blood pressure that remains higher than normal after childbirth.  In most cases, postpartum hypertension will go away on its own, usually within a week of delivery.  For some women, medical treatment is required to prevent serious complications, such as seizures or stroke. This information is not intended to replace advice given to you by your health care provider. Make sure you discuss any questions  you have with your health care provider. Document Revised: 10/09/2018 Document Reviewed: 06/23/2017 Elsevier Patient Education  2020 Elsevier Inc.  Postpartum Care After Vaginal Delivery This sheet gives you information about how to care for yourself from the time you deliver your baby to up to 6-12 weeks after delivery (postpartum period). Your health care provider may also give you more specific instructions. If you have problems or questions, contact your health care provider. Follow these instructions at home: Vaginal bleeding  It is normal to have vaginal bleeding (lochia) after delivery. Wear a sanitary pad for vaginal bleeding and discharge. ? During the first week after delivery, the amount and appearance of lochia is often similar to a menstrual period. ? Over the next few weeks, it will gradually decrease to a dry, yellow-brown discharge. ? For most women, lochia stops completely by 4-6 weeks after delivery. Vaginal bleeding can vary from woman to woman.  Change your sanitary pads frequently. Watch for any changes in your flow, such as: ? A sudden increase in volume. ? A change in color. ? Large blood clots.  If you pass a blood clot from your vagina, save it and call your health care provider to discuss. Do not flush blood clots down the toilet before talking with your health care provider.  Do not use tampons or douches until your health   care provider says this is safe.  If you are not breastfeeding, your period should return 6-8 weeks after delivery. If you are feeding your child breast milk only (exclusive breastfeeding), your period may not return until you stop breastfeeding. Perineal care  Keep the area between the vagina and the anus (perineum) clean and dry as told by your health care provider. Use medicated pads and pain-relieving sprays and creams as directed.  If you had a cut in the perineum (episiotomy) or a tear in the vagina, check the area for signs of  infection until you are healed. Check for: ? More redness, swelling, or pain. ? Fluid or blood coming from the cut or tear. ? Warmth. ? Pus or a bad smell.  You may be given a squirt bottle to use instead of wiping to clean the perineum area after you go to the bathroom. As you start healing, you may use the squirt bottle before wiping yourself. Make sure to wipe gently.  To relieve pain caused by an episiotomy, a tear in the vagina, or swollen veins in the anus (hemorrhoids), try taking a warm sitz bath 2-3 times a day. A sitz bath is a warm water bath that is taken while you are sitting down. The water should only come up to your hips and should cover your buttocks. Breast care  Within the first few days after delivery, your breasts may feel heavy, full, and uncomfortable (breast engorgement). Milk may also leak from your breasts. Your health care provider can suggest ways to help relieve the discomfort. Breast engorgement should go away within a few days.  If you are breastfeeding: ? Wear a bra that supports your breasts and fits you well. ? Keep your nipples clean and dry. Apply creams and ointments as told by your health care provider. ? You may need to use breast pads to absorb milk that leaks from your breasts. ? You may have uterine contractions every time you breastfeed for up to several weeks after delivery. Uterine contractions help your uterus return to its normal size. ? If you have any problems with breastfeeding, work with your health care provider or lactation consultant.  If you are not breastfeeding: ? Avoid touching your breasts a lot. Doing this can make your breasts produce more milk. ? Wear a good-fitting bra and use cold packs to help with swelling. ? Do not squeeze out (express) milk. This causes you to make more milk. Intimacy and sexuality  Ask your health care provider when you can engage in sexual activity. This may depend on: ? Your risk of infection. ? How  fast you are healing. ? Your comfort and desire to engage in sexual activity.  You are able to get pregnant after delivery, even if you have not had your period. If desired, talk with your health care provider about methods of birth control (contraception). Medicines  Take over-the-counter and prescription medicines only as told by your health care provider.  If you were prescribed an antibiotic medicine, take it as told by your health care provider. Do not stop taking the antibiotic even if you start to feel better. Activity  Gradually return to your normal activities as told by your health care provider. Ask your health care provider what activities are safe for you.  Rest as much as possible. Try to rest or take a nap while your baby is sleeping. Eating and drinking   Drink enough fluid to keep your urine pale yellow.  Eat high-fiber   foods every day. These may help prevent or relieve constipation. High-fiber foods include: ? Whole grain cereals and breads. ? Brown rice. ? Beans. ? Fresh fruits and vegetables.  Do not try to lose weight quickly by cutting back on calories.  Take your prenatal vitamins until your postpartum checkup or until your health care provider tells you it is okay to stop. Lifestyle  Do not use any products that contain nicotine or tobacco, such as cigarettes and e-cigarettes. If you need help quitting, ask your health care provider.  Do not drink alcohol, especially if you are breastfeeding. General instructions  Keep all follow-up visits for you and your baby as told by your health care provider. Most women visit their health care provider for a postpartum checkup within the first 3-6 weeks after delivery. Contact a health care provider if:  You feel unable to cope with the changes that your child brings to your life, and these feelings do not go away.  You feel unusually sad or worried.  Your breasts become red, painful, or hard.  You have a  fever.  You have trouble holding urine or keeping urine from leaking.  You have little or no interest in activities you used to enjoy.  You have not breastfed at all and you have not had a menstrual period for 12 weeks after delivery.  You have stopped breastfeeding and you have not had a menstrual period for 12 weeks after you stopped breastfeeding.  You have questions about caring for yourself or your baby.  You pass a blood clot from your vagina. Get help right away if:  You have chest pain.  You have difficulty breathing.  You have sudden, severe leg pain.  You have severe pain or cramping in your lower abdomen.  You bleed from your vagina so much that you fill more than one sanitary pad in one hour. Bleeding should not be heavier than your heaviest period.  You develop a severe headache.  You faint.  You have blurred vision or spots in your vision.  You have bad-smelling vaginal discharge.  You have thoughts about hurting yourself or your baby. If you ever feel like you may hurt yourself or others, or have thoughts about taking your own life, get help right away. You can go to the nearest emergency department or call:  Your local emergency services (911 in the [place]  A suicide crisis helpline, such as the National Suicide Prevention Lifeline at 1-800-273-8255. This is open 24 hours a day. Summary  The period of time right after you deliver your newborn up to 6-12 weeks after delivery is called the postpartum period.  Gradually return to your normal activities as told by your health care provider.  Keep all follow-up visits for you and your baby as told by your health care provider. This information is not intended to replace advice given to you by your health care provider. Make sure you discuss any questions you have with your health care provider. Document Revised: 09/05/2017 Document Reviewed: 06/16/2017 Elsevier Patient Education  2020 Elsevier Inc.  

## 2020-01-19 LAB — BPAM RBC
Blood Product Expiration Date: 202105312359
Blood Product Expiration Date: 202105312359
Unit Type and Rh: 5100
Unit Type and Rh: 5100

## 2020-01-19 LAB — TYPE AND SCREEN
ABO/RH(D): A POS
Antibody Screen: POSITIVE
Donor AG Type: NEGATIVE
Donor AG Type: NEGATIVE
Unit division: 0
Unit division: 0

## 2020-01-25 ENCOUNTER — Encounter: Payer: Medicaid Other | Admitting: Nurse Practitioner

## 2020-01-27 ENCOUNTER — Other Ambulatory Visit: Payer: Self-pay

## 2020-01-27 ENCOUNTER — Ambulatory Visit (INDEPENDENT_AMBULATORY_CARE_PROVIDER_SITE_OTHER): Payer: Medicaid Other

## 2020-01-27 VITALS — BP 134/98 | HR 101 | Wt 115.9 lb

## 2020-01-27 DIAGNOSIS — Z0130 Encounter for examination of blood pressure without abnormal findings: Secondary | ICD-10-CM

## 2020-01-27 NOTE — Progress Notes (Signed)
Patient was assessed and managed by nursing staff during this encounter. I have reviewed the chart and agree with the documentation and plan. I have also made any necessary editorial changes.  Sue Mcalexander, MD 01/27/2020 4:39 PM 

## 2020-01-27 NOTE — Progress Notes (Signed)
Pt here today for BP check s/p vaginal delivery with dx gHTN.  Pt was advised to start taking Procardia 30 mg tablet qid on 01/19/20.  Pt reports that she takes the medication at the night time so her dose was last night.  BP RA 142/95 via dynamap.  Pt denies HTN sx's.  Rpt BP 134/98.  Notified Dr. Macon Large who recommends pt continues to take her BP medication as prescribed, to continue to monitor for sx's of elevated blood pressure, and that we will f/u with her at her appt scheduled on 01/27/20 for incision check.  Pt verbalized understanding with no further questions.   Addison Naegeli, RN  01/27/20

## 2020-02-03 ENCOUNTER — Ambulatory Visit (INDEPENDENT_AMBULATORY_CARE_PROVIDER_SITE_OTHER): Payer: Medicaid Other | Admitting: Family Medicine

## 2020-02-03 ENCOUNTER — Ambulatory Visit: Payer: Medicaid Other

## 2020-02-03 ENCOUNTER — Other Ambulatory Visit: Payer: Self-pay

## 2020-02-03 ENCOUNTER — Encounter: Payer: Self-pay | Admitting: Family Medicine

## 2020-02-03 DIAGNOSIS — N750 Cyst of Bartholin's gland: Secondary | ICD-10-CM | POA: Insufficient documentation

## 2020-02-03 DIAGNOSIS — O139 Gestational [pregnancy-induced] hypertension without significant proteinuria, unspecified trimester: Secondary | ICD-10-CM

## 2020-02-03 NOTE — Assessment & Plan Note (Signed)
BP ok on Procardia, continue for now.

## 2020-02-03 NOTE — Assessment & Plan Note (Signed)
Healing well.

## 2020-02-03 NOTE — Patient Instructions (Signed)
Bartholin's Cyst  A Bartholin's cyst is a fluid-filled sac that forms on a Bartholin's gland. Bartholin's glands are small glands in the folds of skin around the opening of the vagina (labia). This type of cyst causes a bulge or lump near the lower opening of the vagina. If you have a cyst that is small and not infected, you may be able to take care of it at home. If your cyst gets infected, it may cause pain and your doctor may need to drain it. An infected Bartholin's cyst is called a Bartholin's abscess. Follow these instructions at home: Medicines  Take over-the-counter and prescription medicines only as told by your doctor.  If you were prescribed an antibiotic medicine, take it as told by your doctor. Do not stop taking the antibiotic even if you start to feel better. Managing pain and swelling  Try sitz baths to help with pain and swelling. A sitz bath is a warm water bath in which the water only comes up to your hips and should cover your buttocks. You may take sitz baths a few times a day.  Put heat on the affected area as often as needed. Use the heat source that your doctor recommends, such as a moist heat pack or a heating pad. ? Place a towel between your skin and the heat source. ? Leave the heat on for 20-30 minutes. ? Remove the heat if your skin turns bright red. This is especially important if you cannot feel pain, heat, or cold. You may have a greater risk of getting burned. General instructions  If your cyst or abscess was drained: ? Follow instructions from your doctor about how to take care of your wound. ? Use feminine pads to absorb any fluid.  Do not push on or squeeze your cyst.  Do not have sex until the cyst has gone away or your wound from drainage has healed.  Take these steps to help prevent a Bartholin's cyst from returning, and to prevent other Bartholin's cysts from forming: ? Take a bath or shower once a day. Clean your vaginal area with mild soap and  water when you bathe. ? Practice safe sex to prevent STIs (sexually transmitted infections). Talk with your doctor about how to prevent STIs and which forms of birth control (contraception) may be best for you.  Keep all follow-up visits as told by your doctor. This is important. Contact a doctor if:  You have a fever.  You get redness, swelling, or pain around your cyst.  You have fluid, blood, pus, or a bad smell coming from your cyst.  You have a cyst that gets larger or comes back. Summary  A Bartholin's cyst is a fluid-filled sac that forms on a Bartholin's gland. These small glands are found in the folds of skin around the opening of the vagina (labia).  This type of cyst causes a bulge or lump near the lower opening of the vagina. An infected Bartholin's cyst is called a Bartholin's abscess.  Try sitz baths a few times a day to help with pain and swelling.  Do not push on or squeeze your cyst. This information is not intended to replace advice given to you by your health care provider. Make sure you discuss any questions you have with your health care provider. Document Revised: 06/25/2018 Document Reviewed: 06/04/2017 Elsevier Patient Education  2020 Elsevier Inc.  

## 2020-02-03 NOTE — Assessment & Plan Note (Signed)
S/p drainage and catheter insertion. Please keep in for 6 weeks to create an opening if possible to prevent recurrences. Otherwise consider Marsupialization.

## 2020-02-03 NOTE — Progress Notes (Signed)
   Subjective:    Patient ID: Candace Rivera is a 34 y.o. female presenting with Routine Prenatal Visit  on 02/03/2020  HPI: She is 19 days post VAVD with 3c tear who also has GHTN and is on Procardia. She takes it daily and has a h/a. Also with Bartholin's gland. She is not soaking in a tub. It is recurrent on the left. She has trouble sitting.  Review of Systems  Constitutional: Negative for chills and fever.  Respiratory: Negative for shortness of breath.   Cardiovascular: Negative for chest pain.  Gastrointestinal: Negative for abdominal pain, nausea and vomiting.  Genitourinary: Negative for dysuria.  Skin: Negative for rash.      Objective:    BP (!) 128/95   Pulse 97   Wt 111 lb 1.6 oz (50.4 kg)   LMP 04/26/2019   Breastfeeding No   BMI 18.49 kg/m  Physical Exam Constitutional:      General: She is not in acute distress.    Appearance: She is well-developed.  HENT:     Head: Normocephalic and atraumatic.  Eyes:     General: No scleral icterus. Cardiovascular:     Rate and Rhythm: Normal rate.  Pulmonary:     Effort: Pulmonary effort is normal.  Abdominal:     Palpations: Abdomen is soft.  Genitourinary:    Comments: Left, firm Bartholin's gland swelling noted. Musculoskeletal:     Cervical back: Neck supple.  Skin:    General: Skin is warm and dry.  Neurological:     Mental Status: She is alert and oriented to person, place, and time.   Procedure: Bartholin Cyst I&D and Word Catheter Placement Enlarged gland palpated in front of the hymenal ring around 5 o' clock.  Written informed consent was obtained. Patient was examined in the dorsal lithotomy position and mass was identified. 1% Lidocaine (2 ml) was then used to infiltrate area on top of the cyst, behind the hymenal ring.  A 7 mm incision was made using a sterile scapel. Upon palpation of the mass, a moderate amount of dark bloody drainage was expressed through the incision. A Word catheter was  placed. 1.5 ml of sterile water was used to inflate the catheter balloon.  The end of the catheter was tucked into the vagina.  Patient tolerated the procedure well.     Assessment & Plan:   Problem List Items Addressed This Visit      Unprioritized   Gestational hypertension    BP ok on Procardia, continue for now.      Type 3c perineal laceration - Primary    Healing well.      Bartholin gland cyst    S/p drainage and catheter insertion. Please keep in for 6 weeks to create an opening if possible to prevent recurrences. Otherwise consider Marsupialization.         Total time: 20 minutes.  Return if symptoms worsen or fail to improve, for keep postpartum  appointment.  Reva Bores 02/03/2020 5:14 PM

## 2020-02-18 ENCOUNTER — Other Ambulatory Visit: Payer: Self-pay

## 2020-02-18 ENCOUNTER — Ambulatory Visit (INDEPENDENT_AMBULATORY_CARE_PROVIDER_SITE_OTHER): Payer: Medicaid Other | Admitting: Obstetrics and Gynecology

## 2020-02-18 ENCOUNTER — Encounter: Payer: Self-pay | Admitting: Obstetrics and Gynecology

## 2020-02-18 ENCOUNTER — Other Ambulatory Visit (HOSPITAL_COMMUNITY)
Admission: RE | Admit: 2020-02-18 | Discharge: 2020-02-18 | Disposition: A | Discharge status: Home / Self Care | Payer: Medicaid Other | Source: Ambulatory Visit | Attending: Obstetrics and Gynecology | Admitting: Obstetrics and Gynecology

## 2020-02-18 VITALS — BP 116/89 | HR 91 | Ht 65.0 in | Wt 106.3 lb

## 2020-02-18 DIAGNOSIS — D067 Carcinoma in situ of other parts of cervix: Secondary | ICD-10-CM

## 2020-02-18 DIAGNOSIS — R87613 High grade squamous intraepithelial lesion on cytologic smear of cervix (HGSIL): Secondary | ICD-10-CM

## 2020-02-18 IMAGING — US US OB TRANSVAGINAL
1 series · 15 of 27 positions shown · non-contrast
Comparison: None.

CLINICAL DATA: 32-year-old female with abdominal pain in the 1st
trimester of pregnancy. Estimated gestational age by LMP 6 weeks 6
days.

EXAM:
TRANSVAGINAL OB ULTRASOUND
TECHNIQUE: Transvaginal ultrasound was performed for complete evaluation of the
gestation as well as the maternal uterus, adnexal regions, and
pelvic cul-de-sac.

[Series 1: us ob transvaginal · 15 of 27 slices shown]
[im 1/27]
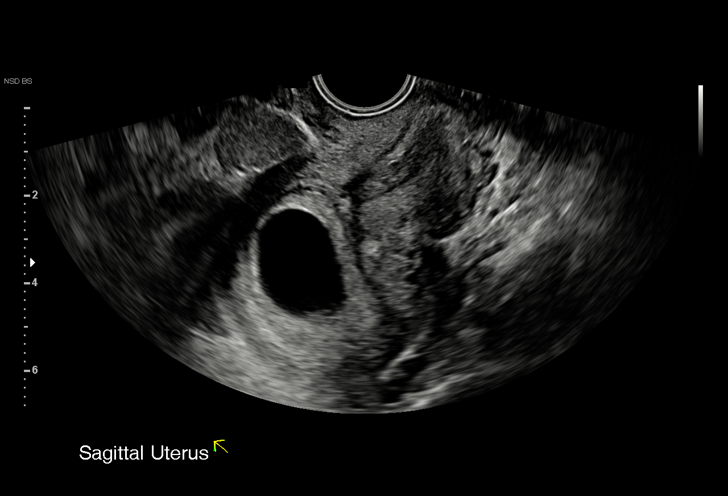
[im 3/27]
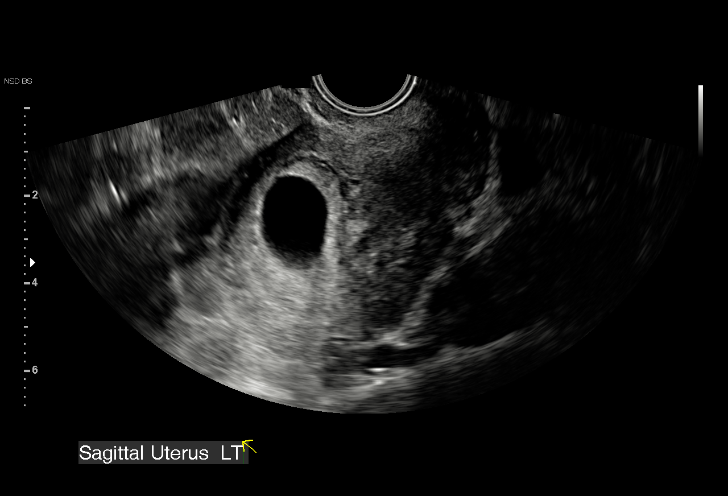
[im 5/27]
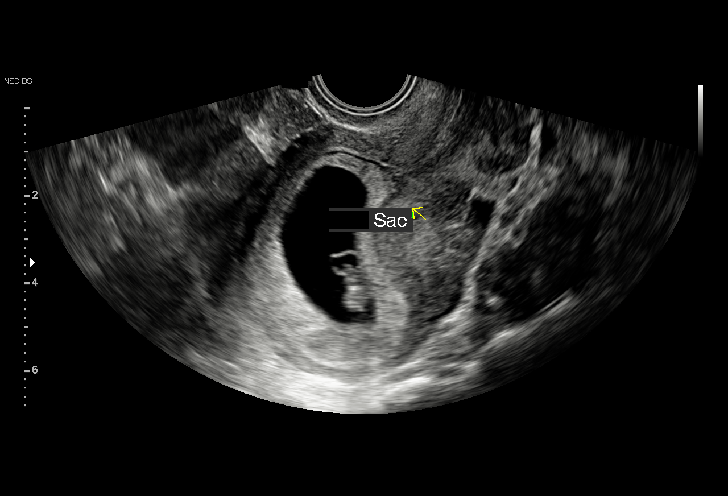
[im 7/27]
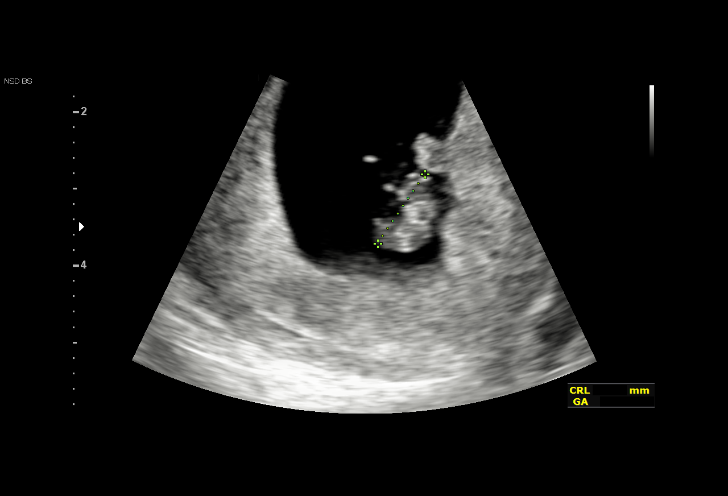
[im 9/27]
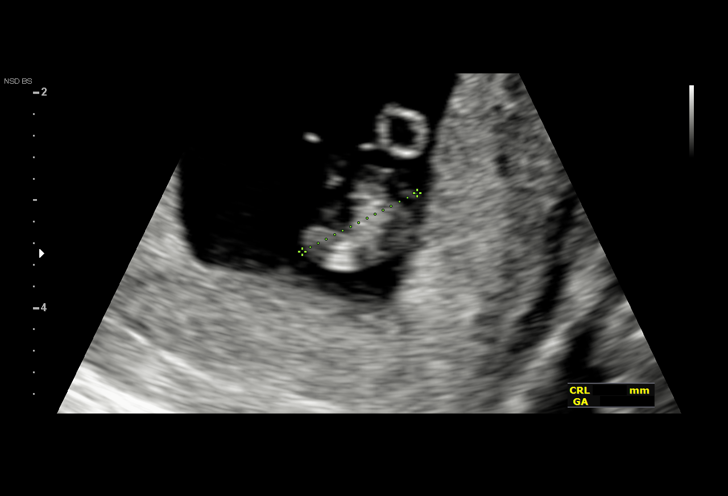
[im 10/27]
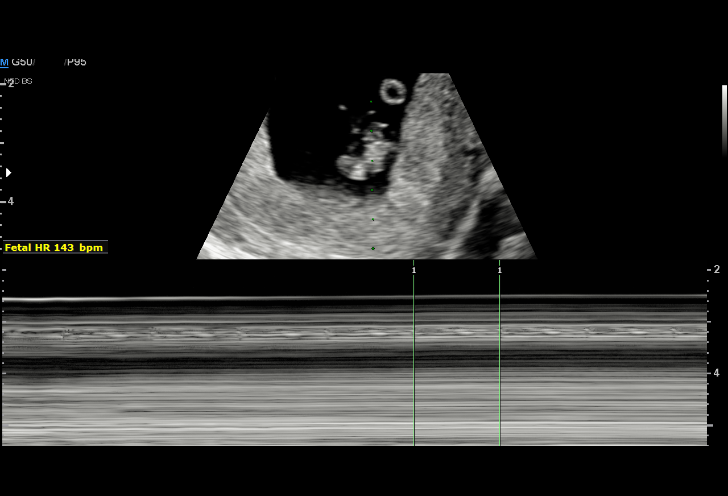
[im 12/27]
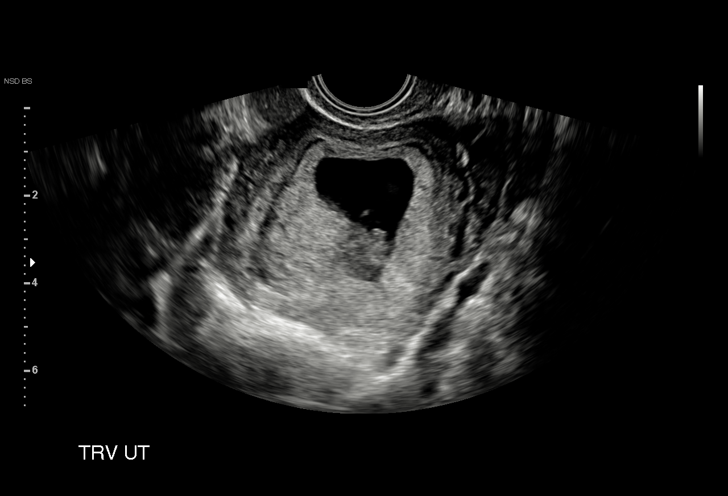
[im 14/27]
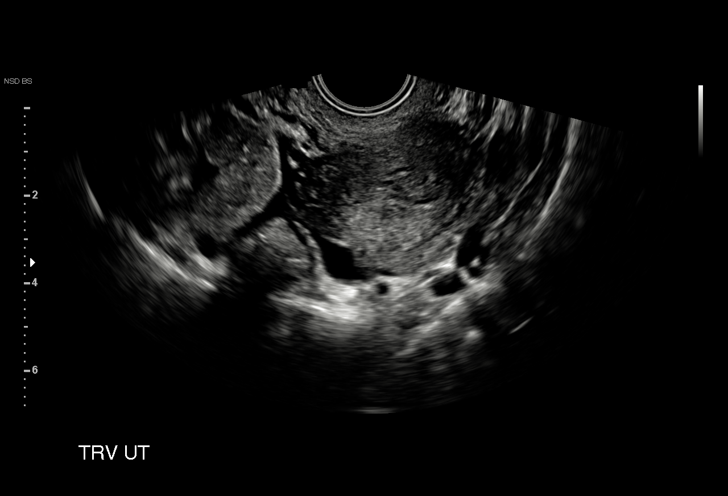
[im 16/27]
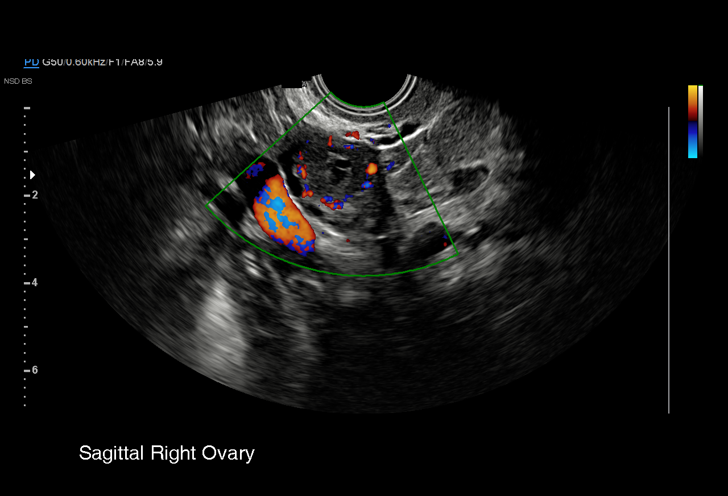
[im 18/27]
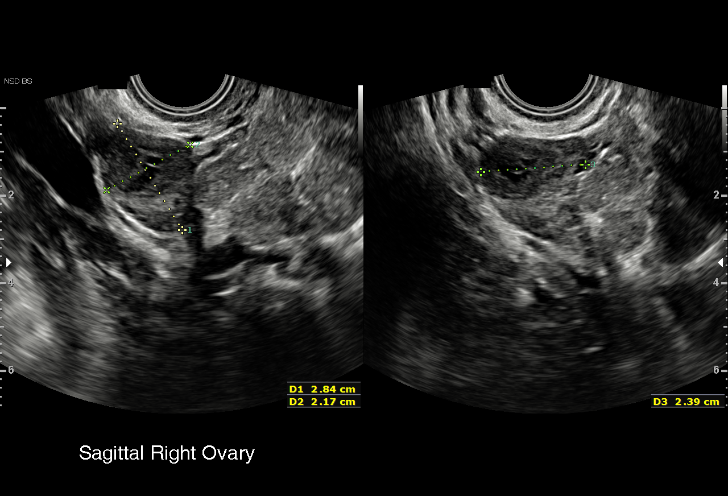
[im 19/27]
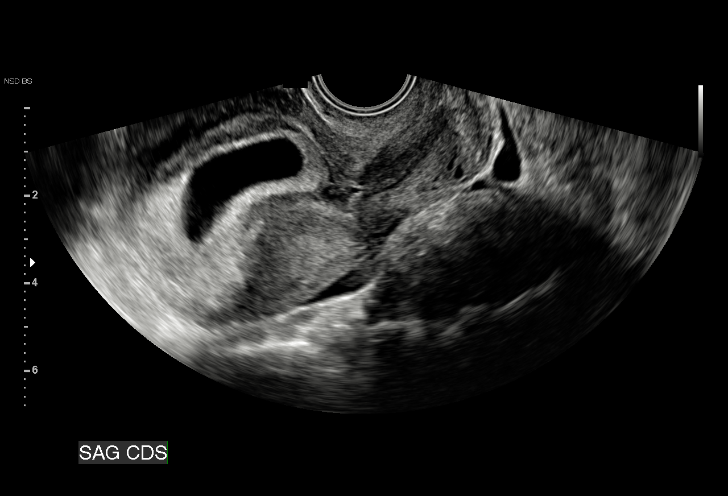
[im 21/27]
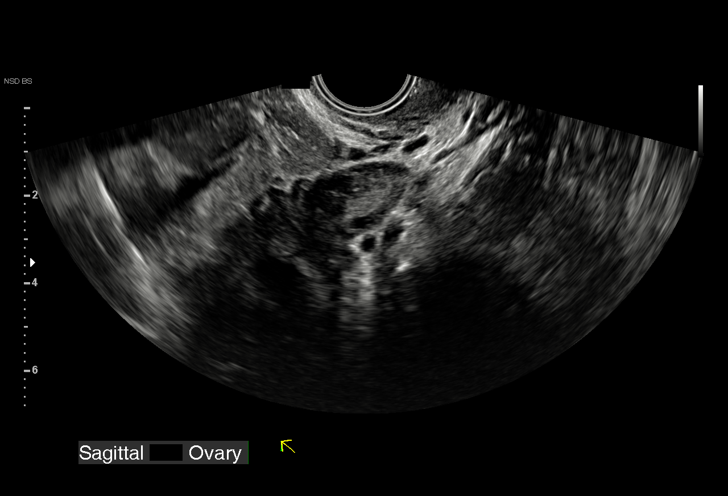
[im 23/27]
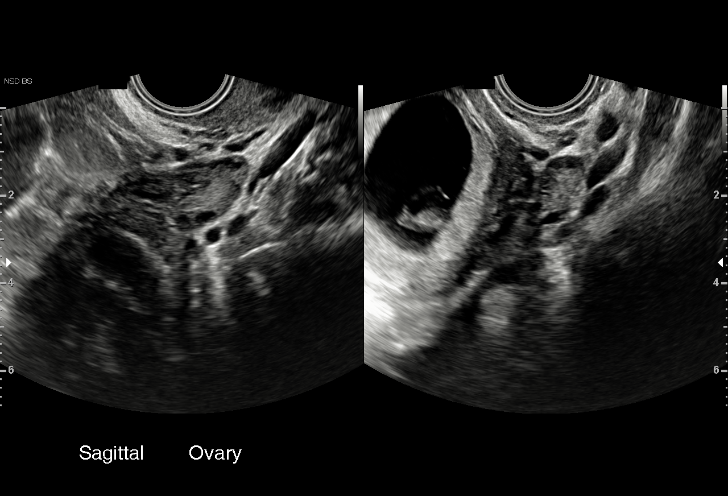
[im 25/27]
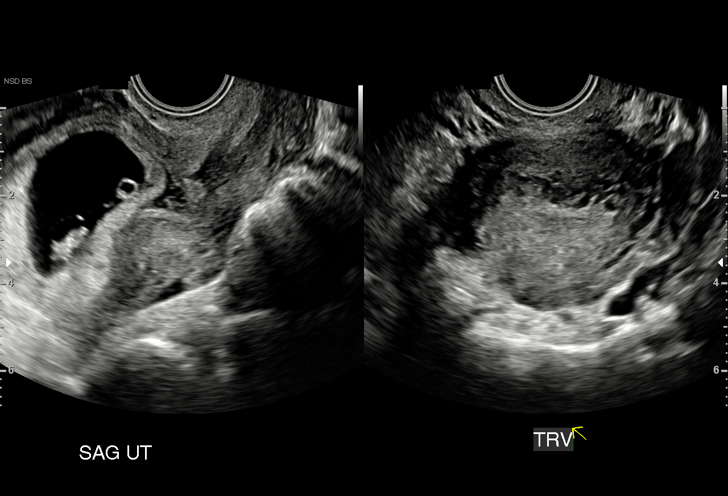
[im 27/27]
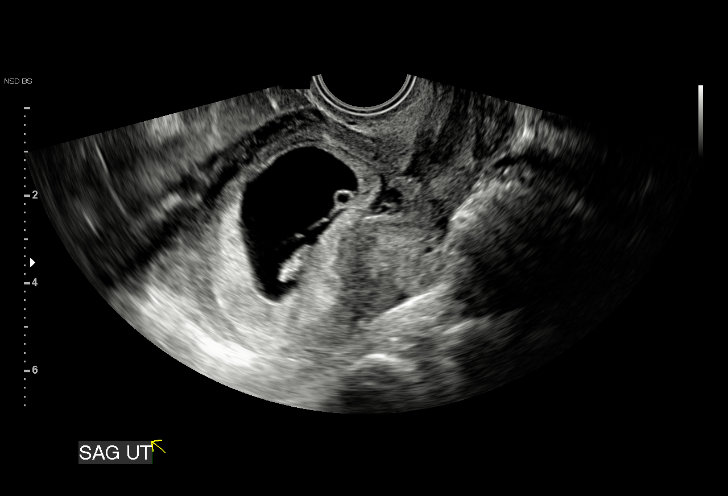

[15 of 27 positions shown; findings below may reference images not displayed]

FINDINGS: Intrauterine gestational sac: Single

Yolk sac:  Visible

Embryo:  Visible

Cardiac Activity: Detected

Heart Rate: 143 bpm

CRL:   11.7 mm   7 w 2 d                  US EDC: 01/28/2020

Subchorionic hemorrhage:  Small volume (image 28).

Maternal uterus/adnexae: Both ovaries appear normal. The right ovary
is 2.8 x 2.2 x 2.4 centimeters on image 20. The left ovary is 3.5 x
1.5 x 1.2 centimeters on image 26. Trace pelvic free fluid.
IMPRESSION: 1.  Single living IUP demonstrated.
2. Small volume subchorionic hemorrhage, trace pelvic free fluid.

## 2020-02-18 NOTE — Procedures (Signed)
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  The patient was placed in the dorsal lithotomy position. A coated Graves was speculum inserted in the vagina, and the cervix was visualized.  A colposcopy was performed with acetic acid and lugol's staining, with the below noted findings. The cervical stromal bed was injected with 47mL of 1% lidocaine with epinephrine. Entering at 12 o'clock on the cervix and using a large shallow Fischer Loop and a setting of 50/50 blend current, a LEEP was done, in one circumferential fashion.  Next, an ECC was done and hemostasis achieved with the ball electrode on 50 coagulation current and application of Monsel's. On coagulation, the entire LEEP bed and surgical margins were thoroughly cauterized  Findings: diffuse AWE changes with increased mosacisim and vasularity  Adequate: Yes  Specimens: LEEP specimen (circumferential, 360 degrees) and ECC   Condition: Stable  Complications: None  Plan: The patient was advised to call for any fever or for prolonged or severe pain or bleeding. She was advised to use OTC analgesics as needed for mild to moderate pain. She was advised to avoid vaginal intercourse for 48 hours or until the bleeding has completely stopped. Pelvic rest was advised until after her four week post operative visit.    Cornelia Copa MD Attending Center for [employer])

## 2020-02-18 NOTE — Progress Notes (Addendum)
    Post Partum Visit Note  Candace Rivera is a 34 y.o. G1P1001 s/p 5/2 VAVD/3c tear. IOL at 37wks for gHTN   I have fully reviewed the prenatal and intrapartum course. Anesthesia: epidural. Postpartum course has been unremarkable. Baby is doing well. Baby is feeding by bottle - Carn[employer] Good Start. Bleeding no bleeding. Bowel function is normal. Bladder function is normal. Patient is not sexually active. Contraception method is Depo-Provera injections.  She states the [facility] balloon fell out a few days after it was placed and she is doing well from that standpoint   Postpartum depression screening: negative.  Review of Systems Pertinent items are noted in HPI.    Objective:  Blood pressure 116/89, pulse 91, height 5\' 5"  (1.651 m), weight 106 lb 4.8 oz (48.2 kg), not currently breastfeeding. NAD EGBUS normal. Former WC site is completely healed. Introitus with a few sutures present but perineum and vagina are well healed. Normal cervix. Uterus nttp  See procedure note for uncomplicated LEEP and ECC Assessment:    normal postpartum exam.   Plan:   Essential components of care per ACOG recommendations:  1.  Mood and well being: no issues  2. Infant care and feeding: no issues  3. Sexuality, contraception and birth spacing: no sexual intercourse yet. Recommend pelvic rest until post leep appt.. pt got depo before leaving the hospital and is doing well with it. Rpt depo at next visit  4. Sleep and fatigue: doing well  5. Physical Recovery: no issues  6.  gHTN D/c procardia and rtc 1wk for bp check  , MD Center for [facility], [facility]

## 2020-02-21 ENCOUNTER — Encounter: Payer: Self-pay | Admitting: Obstetrics and Gynecology

## 2020-02-21 HISTORY — PX: LEEP: PRO1013

## 2020-02-21 LAB — SURGICAL PATHOLOGY

## 2020-02-25 ENCOUNTER — Ambulatory Visit (INDEPENDENT_AMBULATORY_CARE_PROVIDER_SITE_OTHER): Payer: Medicaid Other

## 2020-02-25 ENCOUNTER — Other Ambulatory Visit: Payer: Self-pay

## 2020-02-25 VITALS — BP 113/72 | HR 105

## 2020-02-25 DIAGNOSIS — Z0130 Encounter for examination of blood pressure without abnormal findings: Secondary | ICD-10-CM

## 2020-02-25 NOTE — Progress Notes (Signed)
Pt here today for BP check after d/c of procardia.  Pt denies visual disturbances and headache.  BP RA 113/72. Pt advised to continue to monitor for sx's of elevated BP.  If she has sx's to please check BP with home cuff and call the office if elevated.  Verified pt's f/u LEEP appt scheduled on 04/05/20.  Pt verbalized understanding with no further questions.  Addison Naegeli, RN  02/25/20

## 2020-02-28 NOTE — Progress Notes (Signed)
Patient was assessed and managed by nursing staff during this encounter. I have reviewed the chart and agree with the documentation and plan. I have also made any necessary editorial changes.  Angus Amini, MD 02/28/2020 2:11 PM   

## 2020-04-04 ENCOUNTER — Ambulatory Visit: Payer: Medicaid Other

## 2020-04-05 ENCOUNTER — Ambulatory Visit: Payer: Medicaid Other

## 2020-04-05 ENCOUNTER — Ambulatory Visit: Payer: Medicaid Other | Admitting: Obstetrics and Gynecology

## 2020-04-05 ENCOUNTER — Encounter: Payer: Self-pay | Admitting: Obstetrics and Gynecology

## 2020-04-05 NOTE — Progress Notes (Signed)
Patient did not keep her GYN appointment for 04/05/2020.  Deundra Bard, Jr MD Attending [facility]   

## 2020-04-12 ENCOUNTER — Ambulatory Visit: Payer: Medicaid Other

## 2020-04-26 ENCOUNTER — Ambulatory Visit: Payer: Medicaid Other | Admitting: Obstetrics and Gynecology

## 2020-04-26 ENCOUNTER — Encounter: Payer: Self-pay | Admitting: Family Medicine

## 2020-05-13 IMAGING — US US MFM OB COMP +14 WKS
1 series · 13 of 28 positions shown · non-contrast
Comparison: none

[Series 1: us mfm ob comp +14 wks · 78 acquisitions, 13 frames shown]
[im 3/78]
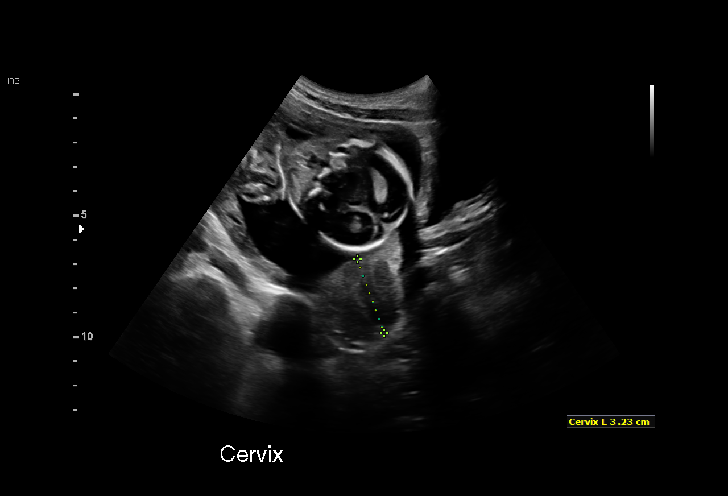
[im 9/78]
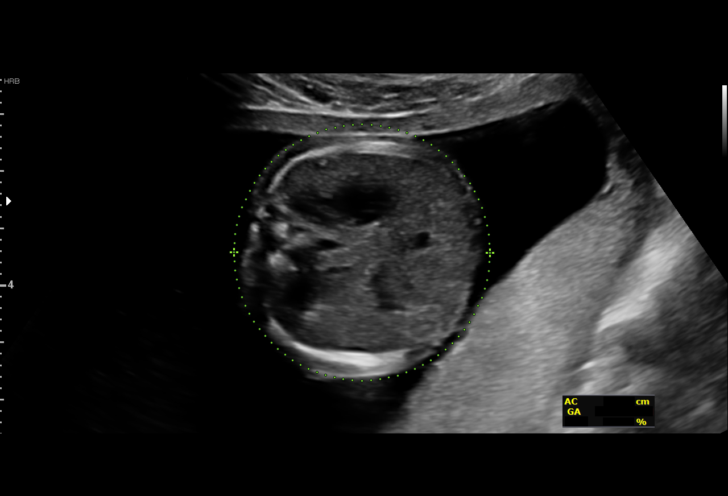
[im 15/78]
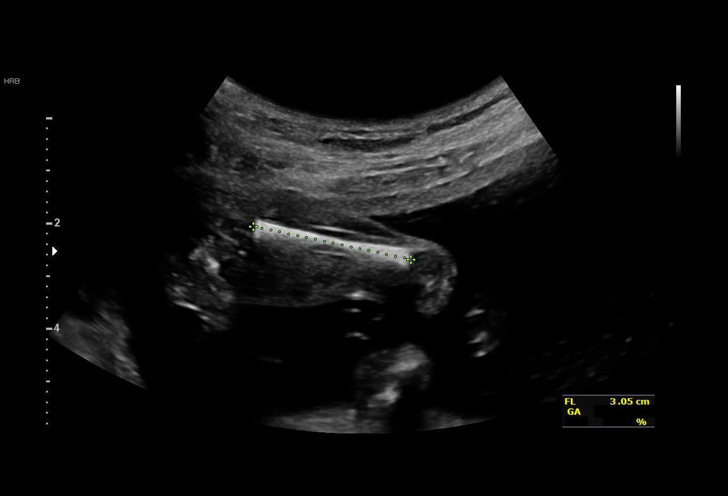
[im 20/78]
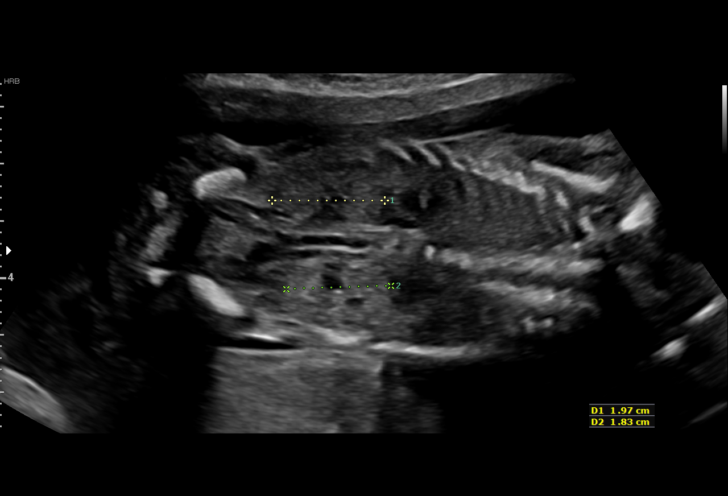
[im 26/78]
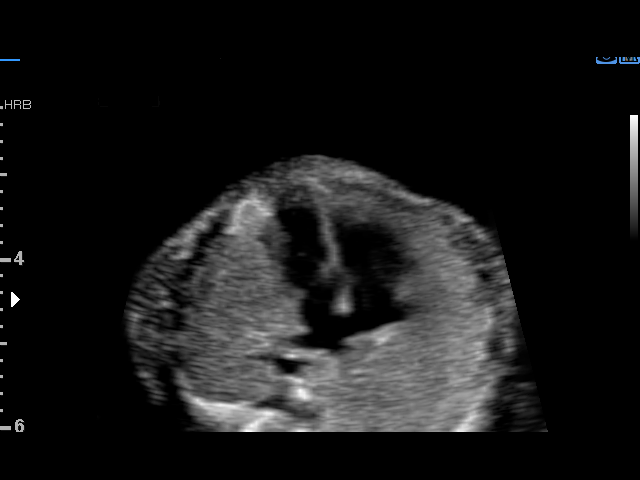
[im 32/78]
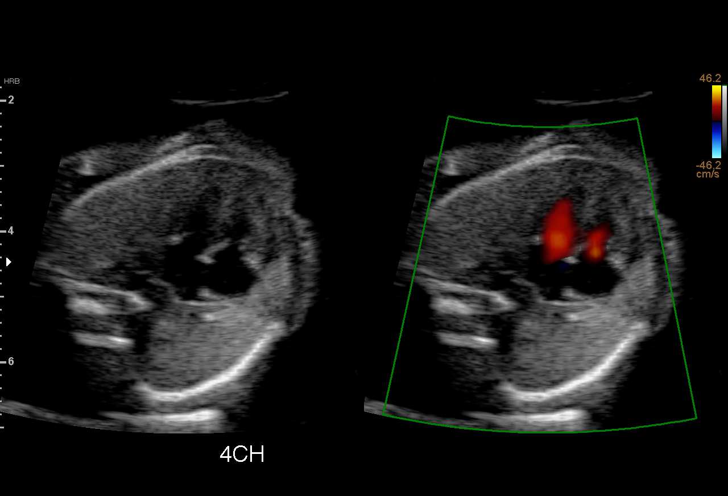
[im 40/78]
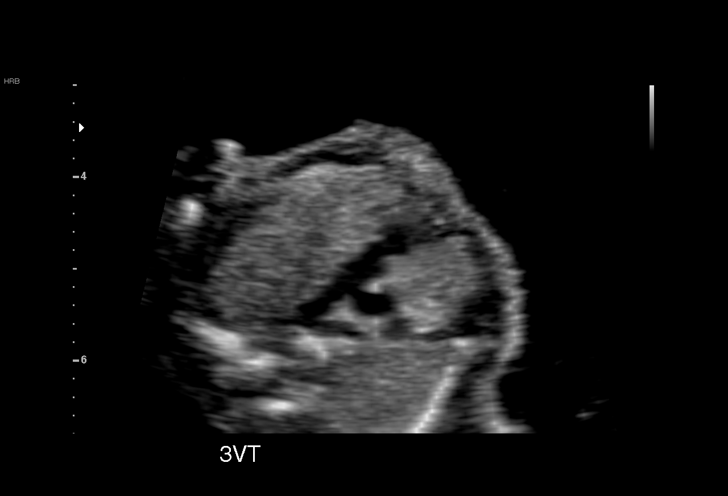
[im 46/78]
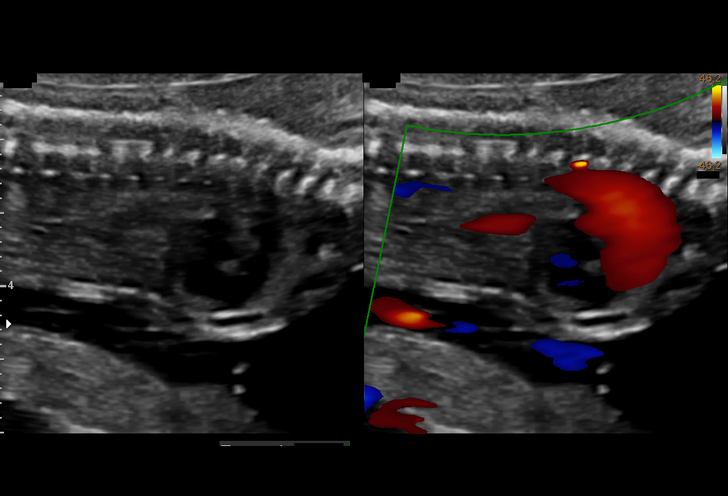
[im 52/78]
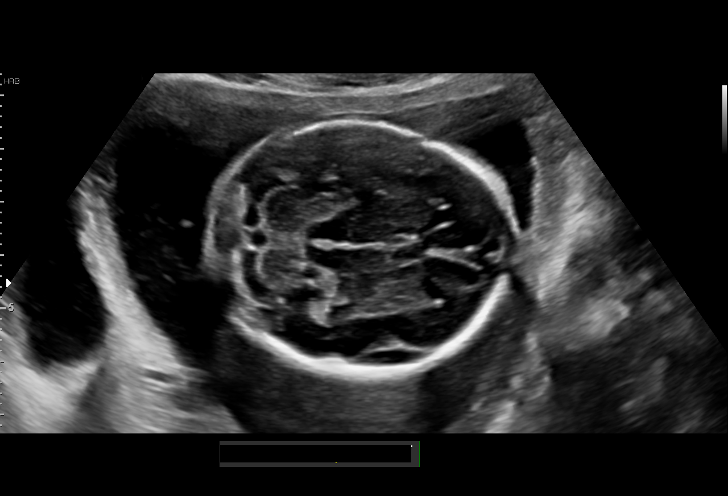
[im 58/78]
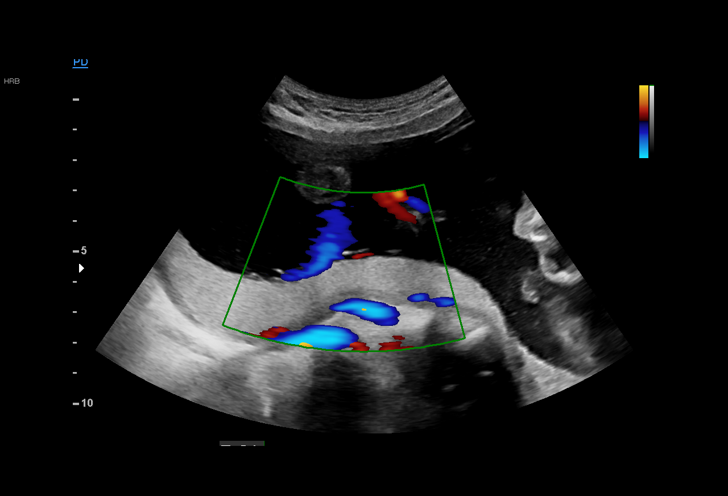
[im 63/78]
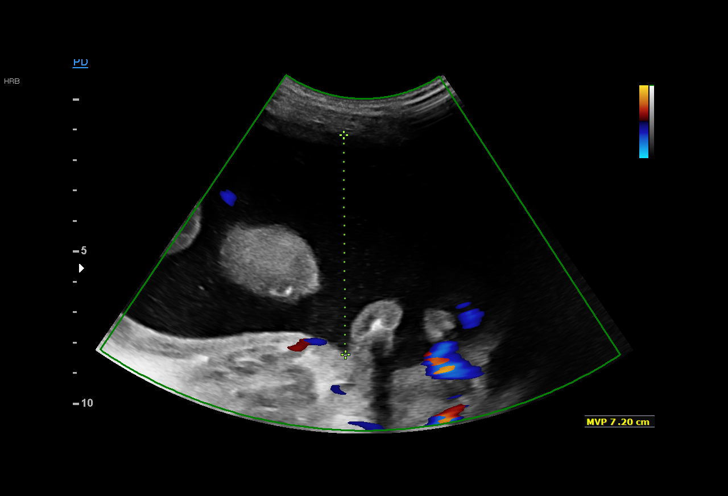
[im 69/78]
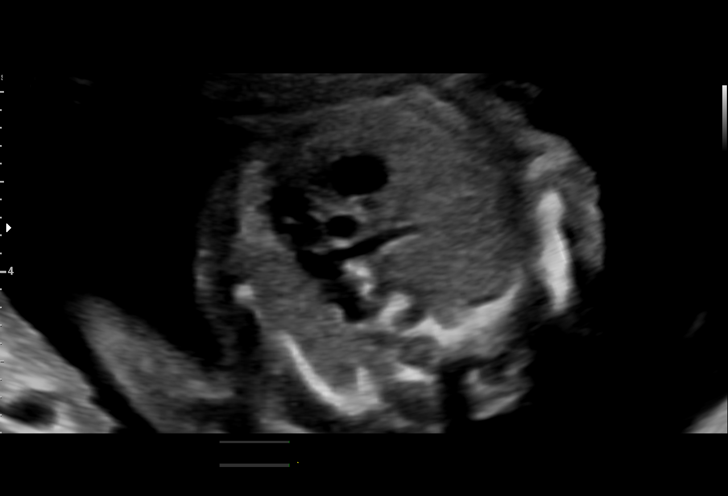
[im 75/78]
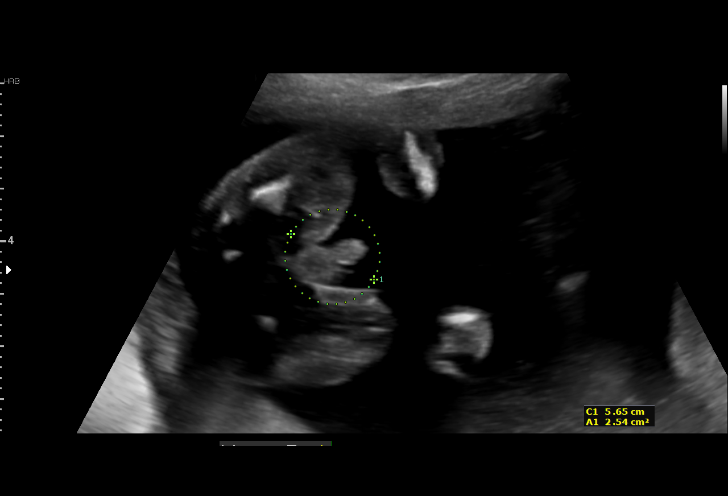

[13 of 28 positions shown; findings below may reference images not displayed]

1  US MFM OB COMP + 14 WK               76805.01     RT[facility] JOSHJAX
 ----------------------------------------------------------------------

 ----------------------------------------------------------------------
Indications

  Encounter for antenatal screening for
  malformations (low risk Panorama, neg
  [place])
  19 weeks gestation of pregnancy
 ----------------------------------------------------------------------
Vital Signs

 BMI:
Fetal Evaluation

 Num Of Fetuses:         1
 Fetal Heart Rate(bpm):  161
 Cardiac Activity:       Observed
 Presentation:           Variable
 Placenta:               Posterior
 P. Cord Insertion:      Previously Visualized

 Amniotic Fluid
 AFI FV:      Within normal limits

                             Largest Pocket(cm)

Biometry

 BPD:      46.1  mm     G. Age:  20w 0d         86  %    CI:         79.6   %    70 - 86
                                                         FL/HC:      18.7   %    16.1 -
 HC:      163.3  mm     G. Age:  19w 1d         47  %    HC/AC:      1.15        1.09 -
 AC:      141.6  mm     G. Age:  19w 4d         64  %    FL/BPD:     66.2   %
 FL:       30.5  mm     G. Age:  19w 3d         60  %    FL/AC:      21.5   %    20 - 24
 HUM:      29.9  mm     G. Age:  19w 6d         71  %
 CER:      20.1  mm     G. Age:  19w 1d         52  %
 NFT:       5.7  mm

 LV:        5.3  mm
 CM:          3  mm

 Est. FW:     294  gm    0 lb 10 oz      73  %
OB History

 Gravidity:    1
 Living:       0
Gestational Age

 LMP:           19w 0d        Date:  04/26/19                 EDD:   01/31/20
 U/S Today:     19w 4d                                        EDD:   01/27/20
 Best:          19w 0d     Det. By:  LMP  (04/26/19)          EDD:   01/31/20
Anatomy

 Cranium:               Appears normal         Aortic Arch:            Appears normal
 Cavum:                 Appears normal         Ductal Arch:            Appears normal
 Ventricles:            Appears normal         Diaphragm:              Appears normal
 Choroid Plexus:        Appears normal         Stomach:                Appears normal, left
                                                                       sided
 Cerebellum:            Appears normal         Abdomen:                Appears normal
 Posterior Fossa:       Appears normal         Abdominal Wall:         Appears nml (cord
                                                                       insert, abd wall)
 Nuchal Fold:           Appears normal         Cord Vessels:           Appears normal (3
                                                                       vessel cord)
 Face:                  Appears normal         Kidneys:                Appear normal
                        (orbits and profile)
 Lips:                  Appears normal         Bladder:                Appears normal
 Thoracic:              Appears normal         Spine:                  Appears normal
 Heart:                 Appears normal         Upper Extremities:      Appears normal
                        (4CH, axis, and
                        situs)
 RVOT:                  Appears normal         Lower Extremities:      Appears normal
 LVOT:                  Appears normal

 Other:  Male gender. Heels and 5th digit visualized. Technically difficult due
         to fetal movement
Cervix Uterus Adnexa

 Cervix
 Length:           3.23  cm.
 Normal appearance by transabdominal scan.

 Uterus
 No abnormality visualized.

 Left Ovary
 No adnexal mass visualized.

 Right Ovary
 No adnexal mass visualized.
 Cul De Sac
 No free fluid seen.

 Adnexa
 No abnormality visualized.
Comments

 This patient was seen for a detailed fetal anatomy scan.
 The fetal growth and amniotic fluid level were appropriate for
 her gestational age.
 There were no obvious fetal anomalies noted on today's
 ultrasound exam.
 Anomalies may be missed due to technical limitations. If the
 fetus is in a suboptimal position or maternal habitus is
 increased, visualization of the fetus in the maternal uterus
 may be impaired.
 Follow up as indicated.

## 2020-05-25 ENCOUNTER — Encounter: Payer: Self-pay | Admitting: General Practice

## 2021-12-10 ENCOUNTER — Ambulatory Visit: Payer: Self-pay | Admitting: Pediatrics

## 2022-01-17 ENCOUNTER — Encounter: Payer: Medicaid Other | Admitting: Obstetrics

## 2022-05-14 ENCOUNTER — Encounter: Payer: Medicaid Other | Admitting: Obstetrics and Gynecology

## 2023-08-06 ENCOUNTER — Telehealth: Payer: Self-pay | Admitting: General Practice

## 2023-08-06 ENCOUNTER — Ambulatory Visit: Payer: Self-pay | Admitting: Nurse Practitioner

## 2023-08-06 NOTE — Telephone Encounter (Signed)
11.20.24 no show/self pay

## 2023-08-06 NOTE — Telephone Encounter (Signed)
Unable to reschedule at [facility]

## 2024-06-04 ENCOUNTER — Encounter: Payer: Self-pay | Admitting: *Deleted
